# Patient Record
Sex: Male | Born: 1990 | Race: Black or African American | Hispanic: No | Marital: Single | Smoking: Light tobacco smoker
Health system: Southern US, Community
[De-identification: ages and names within clinical notes are randomized; demographics above are authoritative.]

## PROBLEM LIST (undated history)

## (undated) ENCOUNTER — Emergency Department (HOSPITAL_COMMUNITY): Discharge status: Absent without leave | Payer: Managed Care, Other (non HMO)

## (undated) DIAGNOSIS — T7840XA Allergy, unspecified, initial encounter: Secondary | ICD-10-CM

## (undated) DIAGNOSIS — J45909 Unspecified asthma, uncomplicated: Secondary | ICD-10-CM

## (undated) DIAGNOSIS — F329 Major depressive disorder, single episode, unspecified: Secondary | ICD-10-CM

## (undated) DIAGNOSIS — F32A Depression, unspecified: Secondary | ICD-10-CM

## (undated) HISTORY — DX: Unspecified asthma, uncomplicated: J45.909

## (undated) HISTORY — DX: Allergy, unspecified, initial encounter: T78.40XA

## (undated) HISTORY — DX: Major depressive disorder, single episode, unspecified: F32.9

## (undated) HISTORY — DX: Depression, unspecified: F32.A

## (undated) HISTORY — PX: OTHER SURGICAL HISTORY: SHX169

---
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
## 2013-04-06 ENCOUNTER — Encounter (INDEPENDENT_AMBULATORY_CARE_PROVIDER_SITE_OTHER): Payer: Managed Care, Other (non HMO) | Admitting: General Surgery

## 2013-04-07 ENCOUNTER — Encounter (INDEPENDENT_AMBULATORY_CARE_PROVIDER_SITE_OTHER): Payer: Managed Care, Other (non HMO) | Admitting: Surgery

## 2013-04-09 ENCOUNTER — Telehealth (INDEPENDENT_AMBULATORY_CARE_PROVIDER_SITE_OTHER): Payer: Self-pay

## 2013-04-09 NOTE — Telephone Encounter (Signed)
Made PCP aware that patient no showed two urgent office appointments for buttock abscess.

## 2013-04-20 ENCOUNTER — Encounter (INDEPENDENT_AMBULATORY_CARE_PROVIDER_SITE_OTHER): Payer: Managed Care, Other (non HMO) | Admitting: Surgery

## 2013-04-21 ENCOUNTER — Encounter (INDEPENDENT_AMBULATORY_CARE_PROVIDER_SITE_OTHER): Payer: Self-pay | Admitting: General Surgery

## 2013-04-21 ENCOUNTER — Ambulatory Visit (INDEPENDENT_AMBULATORY_CARE_PROVIDER_SITE_OTHER): Payer: Managed Care, Other (non HMO) | Admitting: General Surgery

## 2013-04-21 VITALS — BP 118/70 | HR 56 | Temp 97.9°F | Resp 16 | Ht 70.0 in | Wt 145.4 lb

## 2013-04-21 DIAGNOSIS — K611 Rectal abscess: Secondary | ICD-10-CM

## 2013-04-21 DIAGNOSIS — K612 Anorectal abscess: Secondary | ICD-10-CM

## 2013-04-21 MED ORDER — HYDROCODONE-ACETAMINOPHEN 7.5-325 MG/15ML PO SOLN: 15.0000 mL | Freq: Four times a day (QID) | ORAL | Status: AC | PRN

## 2013-04-21 NOTE — Patient Instructions (Signed)
Shower or soak twice a day then cover with clean gauze

## 2013-05-03 ENCOUNTER — Encounter (INDEPENDENT_AMBULATORY_CARE_PROVIDER_SITE_OTHER): Payer: Self-pay | Admitting: General Surgery

## 2013-05-03 ENCOUNTER — Ambulatory Visit (INDEPENDENT_AMBULATORY_CARE_PROVIDER_SITE_OTHER): Payer: Managed Care, Other (non HMO) | Admitting: General Surgery

## 2013-05-03 VITALS — BP 110/68 | HR 60 | Temp 98.2°F | Resp 14 | Ht 70.0 in | Wt 146.0 lb

## 2013-05-03 DIAGNOSIS — K611 Rectal abscess: Secondary | ICD-10-CM

## 2013-05-03 DIAGNOSIS — K612 Anorectal abscess: Secondary | ICD-10-CM

## 2013-05-03 NOTE — Progress Notes (Signed)
Subjective:     Patient ID: Seth Spence, male   DOB: [birth year], 22 y.o.   MRN: [id number]  HPI The patient is a 22 year old black male who is several weeks out from an incision and drainage of a left buttock abscess in [place]. He has been doing local wound care at home and things seem to be getting better. He denies any pain at the site anymore. His appetite is good and his bowels are working normally. He denies any fever or drainage.  Review of Systems  Constitutional: Negative.   HENT: Negative.   Eyes: Negative.   Respiratory: Negative.   Cardiovascular: Negative.   Gastrointestinal: Negative.   Endocrine: Negative.   Genitourinary: Negative.   Musculoskeletal: Negative.   Skin: Negative.   Allergic/Immunologic: Negative.   Neurological: Negative.   Hematological: Negative.   Psychiatric/Behavioral: Negative.        Objective:   Physical Exam  Constitutional: He is oriented to person, place, and time. He appears well-developed and well-nourished.  HENT:  Head: Normocephalic and atraumatic.  Eyes: Conjunctivae and EOM are normal. Pupils are equal, round, and reactive to light.  Neck: Normal range of motion. Neck supple.  Cardiovascular: Normal rate, regular rhythm and normal heart sounds.   Pulmonary/Chest: Effort normal and breath sounds normal.  Abdominal: Soft. Bowel sounds are normal.  Genitourinary:  The buttock wound is clean and flat. It is almost completely healed. There is no drainage or induration or cellulitis.  Musculoskeletal: Normal range of motion.  Neurological: He is alert and oriented to person, place, and time.  Skin: Skin is warm and dry.  Psychiatric: He has a normal mood and affect. His behavior is normal.       Assessment:     The patient is several weeks status post incision and drainage of left buttock abscess in [place]     Plan:     At this point I think the wound will continue healing nicely. He may return to his normal activities.  Will plan to see him back on a when necessary basis

## 2013-05-03 NOTE — Progress Notes (Signed)
Subjective:     Patient ID: Seth Spence, male   DOB: 29-Jan-1991, 22 y.o.   MRN: [id number]  HPI The patient is a 22 year old black male who recently returned from [place]. While there he developed a left buttock abscess. He had this incised and drained by a surgeon there. He has been changing the dressing on the wound. He continues to have some pain. He said minimal drainage from the area.  Review of Systems  Constitutional: Negative.   HENT: Negative.   Eyes: Negative.   Respiratory: Negative.   Cardiovascular: Negative.   Gastrointestinal: Negative.   Endocrine: Negative.   Genitourinary: Negative.   Musculoskeletal: Negative.   Skin: Positive for wound.  Allergic/Immunologic: Negative.   Neurological: Negative.   Hematological: Negative.   Psychiatric/Behavioral: Negative.        Objective:   Physical Exam  Constitutional: He is oriented to person, place, and time. He appears well-developed and well-nourished.  HENT:  Head: Normocephalic and atraumatic.  Eyes: Conjunctivae and EOM are normal. Pupils are equal, round, and reactive to light.  Neck: Normal range of motion. Neck supple.  Cardiovascular: Normal rate, regular rhythm and normal heart sounds.   Pulmonary/Chest: Effort normal and breath sounds normal.  Abdominal: Soft. Bowel sounds are normal.  Genitourinary:  There is an open wound in his left medial buttock area. The wound looks relatively clean with minimal drainage. There is no tunneling. The wound was redressed today and he tolerated this well.  Musculoskeletal: Normal range of motion.  Neurological: He is alert and oriented to person, place, and time.  Skin: Skin is warm and dry.  Psychiatric: He has a normal mood and affect. His behavior is normal.       Assessment:     The patient is a couple weeks status post incision and drainage of left buttock abscess was done in [place]     Plan:     At this point he will continue to shower and change the  dressing daily. We will plan to see him back in the next couple weeks to recheck the wound.

## 2013-05-03 NOTE — Patient Instructions (Signed)
Continue to keep area clean and dry. 

## 2013-05-06 ENCOUNTER — Encounter (INDEPENDENT_AMBULATORY_CARE_PROVIDER_SITE_OTHER): Payer: Managed Care, Other (non HMO) | Admitting: General Surgery

## 2013-05-08 ENCOUNTER — Other Ambulatory Visit (INDEPENDENT_AMBULATORY_CARE_PROVIDER_SITE_OTHER): Payer: Self-pay | Admitting: General Surgery

## 2013-05-10 ENCOUNTER — Telehealth (INDEPENDENT_AMBULATORY_CARE_PROVIDER_SITE_OTHER): Payer: Self-pay | Admitting: General Surgery

## 2013-05-10 NOTE — Telephone Encounter (Signed)
Pt called in requesting a refill on his pain medication. Was seen 6/23 with Toth,MD. Advised Toth,MD with pts request, he advised if pt is in that much pain he needs to be seen in Urgent. At this time there will be no refills authorized for pain medication. I called pt back and advised what information Toth,MD had given. He said OK and slammed the phone down.

## 2014-10-12 ENCOUNTER — Emergency Department (HOSPITAL_COMMUNITY)
Admission: EM | Admit: 2014-10-12 | Discharge: 2014-10-12 | Disposition: A | Discharge status: Home / Self Care | Payer: Managed Care, Other (non HMO) | Attending: Emergency Medicine | Admitting: Emergency Medicine

## 2014-10-12 ENCOUNTER — Encounter (HOSPITAL_COMMUNITY): Payer: Self-pay | Admitting: Emergency Medicine

## 2014-10-12 DIAGNOSIS — K029 Dental caries, unspecified: Secondary | ICD-10-CM | POA: Insufficient documentation

## 2014-10-12 DIAGNOSIS — K088 Other specified disorders of teeth and supporting structures: Secondary | ICD-10-CM | POA: Diagnosis present

## 2014-10-12 DIAGNOSIS — K0381 Cracked tooth: Secondary | ICD-10-CM | POA: Diagnosis not present

## 2014-10-12 DIAGNOSIS — Z720 Tobacco use: Secondary | ICD-10-CM | POA: Diagnosis not present

## 2014-10-12 DIAGNOSIS — K0889 Other specified disorders of teeth and supporting structures: Secondary | ICD-10-CM

## 2014-10-12 MED ORDER — AMOXICILLIN 500 MG PO CAPS: 500.0000 mg | ORAL_CAPSULE | Freq: Three times a day (TID) | ORAL | Status: DC

## 2014-10-12 MED ORDER — LIDOCAINE VISCOUS 2 % MT SOLN
15.0000 mL | Freq: Once | OROMUCOSAL | Status: AC
  Administered 2014-10-12: 15 mL via OROMUCOSAL
  Filled 2014-10-12: qty 15

## 2014-10-12 MED ORDER — OXYCODONE-ACETAMINOPHEN 5-325 MG PO TABS: 1.0000 | ORAL_TABLET | ORAL | Status: DC | PRN

## 2014-10-12 MED ORDER — OXYCODONE-ACETAMINOPHEN 5-325 MG PO TABS
2.0000 | ORAL_TABLET | Freq: Once | ORAL | Status: AC
  Administered 2014-10-12: 2 via ORAL
  Filled 2014-10-12: qty 2

## 2014-10-12 NOTE — ED Provider Notes (Signed)
CSN: [id number]     Arrival date & time 10/12/14  0014 History   First MD Initiated Contact with Patient 10/12/14 0111     Chief Complaint  Patient presents with  . Dental Pain     (Consider location/radiation/quality/duration/timing/severity/associated sxs/prior Treatment) Patient is a 23 y.o. male presenting with tooth pain.  Dental Pain Location:  Lower Lower teeth location:  32/RL 3rd molar Quality:  Throbbing, aching and constant Severity:  Severe Onset quality:  Gradual Duration:  3 days Timing:  Constant Progression:  Worsening Chronicity:  New Context: dental caries, dental fracture and poor dentition   Context: not abscess   Relieved by:  Nothing Worsened by:  Cold food/drink Ineffective treatments:  NSAIDs and topical anesthetic gel Associated symptoms: no facial swelling and no trismus   Risk factors: smoking     History reviewed. No pertinent past medical history. History reviewed. No pertinent past surgical history. No family history on file. History  Substance Use Topics  . Smoking status: Heavy Tobacco Smoker -- 0.50 packs/day  . Smokeless tobacco: Never Used  . Alcohol Use: No    Review of Systems  HENT: Positive for dental problem. Negative for facial swelling.   All other systems reviewed and are negative.     Allergies  Review of patient's allergies indicates no known allergies.  Home Medications   Prior to Admission medications   Medication Sig Start Date End Date Taking? Authorizing Provider  amoxicillin (AMOXIL) 500 MG capsule Take 1 capsule (500 mg total) by mouth 3 (three) times daily. 10/12/14   Arline Ketter L Chenay Nesmith, PA-C  ibuprofen (ADVIL,MOTRIN) 800 MG tablet  04/01/13   Historical Provider, MD  oxyCODONE-acetaminophen (PERCOCET/ROXICET) 5-325 MG per tablet Take 1 tablet by mouth every 4 (four) hours as needed for severe pain. May take 2 tablets PO q 6 hours for severe pain - Do not take with Tylenol as this tablet already contains  tylenol 10/12/14   Rael Yo L Lounell Schumacher, PA-C   BP 118/71 mmHg  Pulse 66  Temp(Src) 97.9 F (36.6 C) (Oral)  Resp 18  Ht 5\' 9"  (1.753 m)  Wt 150 lb (68.04 kg)  BMI 22.14 kg/m2  SpO2 98% Physical Exam  Constitutional: He is oriented to person, place, and time. He appears well-developed and well-nourished. No distress.  HENT:  Head: Normocephalic and atraumatic.  Right Ear: External ear normal.  Left Ear: External ear normal.  Nose: Nose normal.  Mouth/Throat: Uvula is midline and mucous membranes are normal. No trismus in the jaw. Abnormal dentition. Dental caries present. No uvula swelling.    Submental and sublingual spaces are soft.  Eyes: Conjunctivae are normal.  Neck: Normal range of motion. Neck supple.  Cardiovascular: Normal rate.   Pulmonary/Chest: Effort normal.  Neurological: He is alert and oriented to person, place, and time.  Skin: Skin is warm and dry. He is not diaphoretic.  Psychiatric: He has a normal mood and affect.  Nursing note and vitals reviewed.   ED Course  Procedures (including critical care time) Medications  lidocaine (XYLOCAINE) 2 % viscous mouth solution 15 mL (not administered)  oxyCODONE-acetaminophen (PERCOCET/ROXICET) 5-325 MG per tablet 2 tablet (not administered)    Labs Review Labs Reviewed - No data to display  Imaging Review No results found.   EKG Interpretation None      MDM   Final diagnoses:  Pain, dental    Filed Vitals:   10/12/14 0021  BP: 118/71  Pulse: 66  Temp: 97.9 F (36.6 C)  Resp: 18   Afebrile, NAD, non-toxic appearing, AAOx4.  Patient with toothache.  No gross abscess.  Exam unconcerning for Ludwig's angina or spread of infection.  Will treat with amoxil and pain medicine.  Urged patient to keep follow up with dentist in the AM. Patient is stable at time of discharge       Jeannetta EllisJennifer L Crockett Rallo, PA-C 10/12/14 0133  Harrold DonathNathan R. Rubin PayorPickering, MD 10/13/14 [phone]

## 2014-10-12 NOTE — Discharge Instructions (Signed)
Please follow up with your primary care physician in 1-2 days. If you do not have one please [facility] listed above. Please keep your appointment with the dentist in the morning. Please take pain medication and/or muscle relaxants as prescribed and as needed for pain. Please do not drive on narcotic pain medication or on muscle relaxants. Please read all discharge instructions and return precautions.    Dental Pain A tooth ache may be caused by cavities (tooth decay). Cavities expose the nerve of the tooth to air and hot or cold temperatures. It may come from an infection or abscess (also called a boil or furuncle) around your tooth. It is also often caused by dental caries (tooth decay). This causes the pain you are having. DIAGNOSIS  Your caregiver can diagnose this problem by exam. TREATMENT   If caused by an infection, it may be treated with medications which kill germs (antibiotics) and pain medications as prescribed by your caregiver. Take medications as directed.  Only take over-the-counter or prescription medicines for pain, discomfort, or fever as directed by your caregiver.  Whether the tooth ache today is caused by infection or dental disease, you should see your dentist as soon as possible for further care. SEEK MEDICAL CARE IF: The exam and treatment you received today has been provided on an emergency basis only. This is not a substitute for complete medical or dental care. If your problem worsens or new problems (symptoms) appear, and you are unable to meet with your dentist, call or return to this location. SEEK IMMEDIATE MEDICAL CARE IF:   You have a fever.  You develop redness and swelling of your face, jaw, or neck.  You are unable to open your mouth.  You have severe pain uncontrolled by pain medicine. MAKE SURE YOU:   Understand these instructions.  Will watch your condition.  Will get help right away if you are not doing well  or get worse. Document Released: 10/28/2005 Document Revised: 01/20/2012 Document Reviewed: 06/15/2008 [facility] Patient Information 2015 [employer] [place]. This information is not intended to replace advice given to you by your health care provider. Make sure you discuss any questions you have with your health care provider.

## 2014-10-12 NOTE — ED Notes (Signed)
Pt. reports right lower molar pain for 3 days unrelieved by OTC pain medications .

## 2015-03-07 ENCOUNTER — Encounter (HOSPITAL_COMMUNITY): Payer: Self-pay | Admitting: Emergency Medicine

## 2015-03-07 ENCOUNTER — Emergency Department (HOSPITAL_COMMUNITY)
Admission: EM | Admit: 2015-03-07 | Discharge: 2015-03-07 | Disposition: A | Discharge status: Home / Self Care | Payer: Managed Care, Other (non HMO) | Attending: Emergency Medicine | Admitting: Emergency Medicine

## 2015-03-07 DIAGNOSIS — Y998 Other external cause status: Secondary | ICD-10-CM | POA: Diagnosis not present

## 2015-03-07 DIAGNOSIS — Z720 Tobacco use: Secondary | ICD-10-CM | POA: Diagnosis not present

## 2015-03-07 DIAGNOSIS — W228XXA Striking against or struck by other objects, initial encounter: Secondary | ICD-10-CM | POA: Diagnosis not present

## 2015-03-07 DIAGNOSIS — Y9289 Other specified places as the place of occurrence of the external cause: Secondary | ICD-10-CM | POA: Insufficient documentation

## 2015-03-07 DIAGNOSIS — R55 Syncope and collapse: Secondary | ICD-10-CM | POA: Diagnosis not present

## 2015-03-07 DIAGNOSIS — S0990XA Unspecified injury of head, initial encounter: Secondary | ICD-10-CM | POA: Diagnosis present

## 2015-03-07 DIAGNOSIS — S0181XA Laceration without foreign body of other part of head, initial encounter: Secondary | ICD-10-CM | POA: Diagnosis not present

## 2015-03-07 DIAGNOSIS — Y9389 Activity, other specified: Secondary | ICD-10-CM | POA: Diagnosis not present

## 2015-03-07 LAB — I-STAT CHEM 8, ED
BUN: 18 mg/dL (ref 6–23)
Calcium, Ion: 1.19 mmol/L (ref 1.12–1.23)
Chloride: 99 mmol/L (ref 96–112)
Creatinine, Ser: 1 mg/dL (ref 0.50–1.35)
Glucose, Bld: 107 mg/dL — ABNORMAL HIGH (ref 70–99)
HCT: 49 % (ref 39.0–52.0)
HEMOGLOBIN: 16.7 g/dL (ref 13.0–17.0)
POTASSIUM: 4.5 mmol/L (ref 3.5–5.1)
Sodium: 138 mmol/L (ref 135–145)
TCO2: 25 mmol/L (ref 0–100)

## 2015-03-07 MED ORDER — LIDOCAINE-EPINEPHRINE (PF) 2 %-1:200000 IJ SOLN
20.0000 mL | Freq: Once | INTRAMUSCULAR | Status: AC
  Administered 2015-03-07: 20 mL
  Filled 2015-03-07: qty 20

## 2015-03-07 NOTE — ED Notes (Signed)
PA-C Harris at bedside. 

## 2015-03-07 NOTE — Discharge Instructions (Signed)

## 2015-03-07 NOTE — ED Notes (Signed)
Pt states that he was looking in the mirror, and the next thing he remembers was waking up on the floor. Pt states that before he fell, he was feeling lightheaded.

## 2015-03-07 NOTE — ED Notes (Signed)
Pt. lost his balance and hit his head against the sink this evening , presents with approx. 1/2 inch laceration at lower forehead with minimal bleeding / dressing applied art triage . Alert and oriented / respirations unlabored / ambulatory.

## 2015-03-07 NOTE — ED Provider Notes (Signed)
LACERATION REPAIR Date/Time: 03/07/2015 7:50 AM Performed by: Arthor CaptainHARRIS, Seth Banker Authorized by: Arthor CaptainHARRIS, Seth Spence Consent: Verbal consent obtained. Risks and benefits: risks, benefits and alternatives were discussed Consent given by: patient Patient identity confirmed: verbally with patient and provided demographic data Time out: Immediately prior to procedure a "time out" was called to verify the correct patient, procedure, equipment, support staff and site/side marked as required. Body area: head/neck Location details: left eyebrow Laceration length: 3 cm Foreign bodies: no foreign bodies Tendon involvement: none Nerve involvement: none Vascular damage: no Anesthesia: local infiltration Local anesthetic: lidocaine 2% without epinephrine Anesthetic total: 2 ml Patient sedated: no Preparation: Patient was prepped and draped in the usual sterile fashion. Irrigation solution: saline Irrigation method: syringe Amount of cleaning: standard Debridement: none Degree of undermining: minimal Skin closure: 5-0 Prolene Number of sutures: 4 Technique: simple Approximation: close Dressing: 4x4 sterile gauze Comments: I performed this procedure for Dr. Azalia BilisKevin Spence. I was not involved in mdm of this patient.     Arthor CaptainAbigail Azaria Stegman, PA-C 03/07/15 [id number]  [name], MD 03/07/15 [phone]

## 2015-03-07 NOTE — ED Provider Notes (Addendum)
CSN: [id number]     Arrival date & time 03/07/15  0155 History   First MD Initiated Contact with Patient 03/07/15 [phone]     Chief Complaint  Patient presents with  . Head Laceration  . Loss of Consciousness      HPI Patient reports he was looking at the mirror and the next thing he realized he was on the ground.  He presents now with a laceration to his midline forehead.  No active bleeding.  Reports no significant headache at this time.  He is not on anticoagulants.  He denies neck pain.  No weakness of his arms or legs.  No recent illness.  No preceding palpitations. Denies CP. No prior hx of syncope.    History reviewed. No pertinent past medical history. History reviewed. No pertinent past surgical history. No family history on file. History  Substance Use Topics  . Smoking status: Heavy Tobacco Smoker -- 0.50 packs/day  . Smokeless tobacco: Never Used  . Alcohol Use: No    Review of Systems  All other systems reviewed and are negative.     Allergies  Review of patient's allergies indicates no known allergies.  Home Medications   Prior to Admission medications   Medication Sig Start Date End Date Taking? Authorizing Provider  amoxicillin (AMOXIL) 500 MG capsule Take 1 capsule (500 mg total) by mouth 3 (three) times daily. Patient not taking: Reported on 03/07/2015 10/12/14   Francee Piccolo, PA-C  oxyCODONE-acetaminophen (PERCOCET/ROXICET) 5-325 MG per tablet Take 1 tablet by mouth every 4 (four) hours as needed for severe pain. May take 2 tablets PO q 6 hours for severe pain - Do not take with Tylenol as this tablet already contains tylenol Patient not taking: Reported on 03/07/2015 10/12/14   Victorino Dike Piepenbrink, PA-C   BP 101/59 mmHg  Pulse 52  Temp(Src) 98 F (36.7 C) (Oral)  Resp 16  Ht  (1.778 m)  Wt 145 lb (65.772 kg)  BMI 20.81 kg/m2  SpO2 100% Physical Exam  Constitutional: He is oriented to person, place, and time. He appears well-developed and  well-nourished.  HENT:  Head: Normocephalic and atraumatic.  3cm laceration just superior to the left eyebrow and supraorbital rim. EOM normal.   Eyes: EOM are normal.  Neck: Normal range of motion.  c spine nontender  Cardiovascular: Normal rate, regular rhythm, normal heart sounds and intact distal pulses.   Pulmonary/Chest: Effort normal and breath sounds normal. No respiratory distress.  Abdominal: Soft. He exhibits no distension. There is no tenderness.  Musculoskeletal: Normal range of motion.  Neurological: He is alert and oriented to person, place, and time.  Skin: Skin is warm and dry.  Psychiatric: He has a normal mood and affect. Judgment normal.  Nursing note and vitals reviewed.   ED Course  Procedures (including critical care time) Labs Review Labs Reviewed  I-STAT CHEM 8, ED    Imaging Review No results found.   EKG Interpretation   Date/Time:  Tuesday March 07 2015 05:25:11 EDT Ventricular Rate:  56 PR Interval:  152 QRS Duration: 91 QT Interval:  392 QTC Calculation: 378 R Axis:   100 Text Interpretation:  Sinus rhythm Borderline right axis deviation ST  elev, probable normal early repol pattern No old tracing to compare  Confirmed by Devlin Brink  MD, Skye Plamondon ([id number]) on 03/07/2015 6:15:24 AM      MDM   Final diagnoses:  None    EKG without abnormality.  No indication for head CT.  C-spine is nontender.  C-spine cleared by Nexus criteria.  No indication for imaging of head or neck.  Basic labs will be obtained.  These are normal the patient can be discharged home to follow-up closely with a primary care physician.  Infection and head injury warnings given.  Laceration to be repaired by my physician assistant.  Suture removal in 7 days    Azalia BilisKevin Shaheed Schmuck, MD 03/07/15 [id number]  Azalia BilisKevin Nirvan Laban, MD 06/05/15 [phone]

## 2015-07-28 ENCOUNTER — Emergency Department (HOSPITAL_COMMUNITY)
Admission: EM | Admit: 2015-07-28 | Discharge: 2015-07-28 | Disposition: A | Discharge status: Home / Self Care | Payer: Managed Care, Other (non HMO) | Attending: Emergency Medicine | Admitting: Emergency Medicine

## 2015-07-28 ENCOUNTER — Encounter (HOSPITAL_COMMUNITY): Payer: Self-pay | Admitting: Emergency Medicine

## 2015-07-28 DIAGNOSIS — R110 Nausea: Secondary | ICD-10-CM | POA: Diagnosis not present

## 2015-07-28 DIAGNOSIS — Z8619 Personal history of other infectious and parasitic diseases: Secondary | ICD-10-CM | POA: Diagnosis not present

## 2015-07-28 DIAGNOSIS — L0231 Cutaneous abscess of buttock: Secondary | ICD-10-CM | POA: Diagnosis present

## 2015-07-28 DIAGNOSIS — K611 Rectal abscess: Secondary | ICD-10-CM | POA: Insufficient documentation

## 2015-07-28 DIAGNOSIS — Z720 Tobacco use: Secondary | ICD-10-CM | POA: Diagnosis not present

## 2015-07-28 MED ORDER — LIDOCAINE-EPINEPHRINE (PF) 2 %-1:200000 IJ SOLN
20.0000 mL | Freq: Once | INTRAMUSCULAR | Status: AC
  Administered 2015-07-28: 20 mL via INTRADERMAL
  Filled 2015-07-28: qty 20

## 2015-07-28 MED ORDER — OXYCODONE-ACETAMINOPHEN 5-325 MG PO TABS: 1.0000 | ORAL_TABLET | Freq: Four times a day (QID) | ORAL | Status: DC | PRN

## 2015-07-28 MED ORDER — CLINDAMYCIN HCL 300 MG PO CAPS: 300.0000 mg | ORAL_CAPSULE | Freq: Four times a day (QID) | ORAL | Status: DC

## 2015-07-28 MED ORDER — OXYCODONE-ACETAMINOPHEN 5-325 MG PO TABS
1.0000 | ORAL_TABLET | Freq: Once | ORAL | Status: AC
  Administered 2015-07-28: 1 via ORAL
  Filled 2015-07-28: qty 1

## 2015-07-28 NOTE — ED Notes (Signed)
Per EMS, patient has abcess on left buttock, and has hx of frequent staph infections. Report having fever. VS: bp 112/70, p 84. Patient is from home.

## 2015-07-28 NOTE — ED Notes (Signed)
Dr. yelverton at the bedside.  

## 2015-07-28 NOTE — ED Provider Notes (Signed)
CSN: [id number]     Arrival date & time 07/28/15  0325 History   This chart was scribed for Loren Racer, MD by Arlan Organ, ED Scribe. This patient was seen in room A12C/A12C and the patient's care was started 3:35 AM.   Chief Complaint  Patient presents with  . Abscess   The history is provided by the patient. No language interpreter was used.    HPI Comments: Seth Spence brought in by EMS from home is a 24 y.o. male with a PMHx of abscesses and frequent staph infections who presents to the Emergency Department here for an abscess to the L buttock onset few hours. Denies any drainage from area. Associated nausea also reports. No at home remedies attempted prior to arrival. Denies any fever, chills, vomiting, shortness of breath, or chest pain. No known allergies to medications.  History reviewed. No pertinent past medical history. Past Surgical History  Procedure Laterality Date  . Other surgical history      two years ago, i had abcess lanced   History reviewed. No pertinent family history. Social History  Substance Use Topics  . Smoking status: Heavy Tobacco Smoker -- 0.50 packs/day    Types: Cigarettes  . Smokeless tobacco: Never Used  . Alcohol Use: Yes     Comment: 1 beer per day    Review of Systems  Constitutional: Negative for fever and chills.  Respiratory: Negative for cough and shortness of breath.   Cardiovascular: Negative for chest pain.  Gastrointestinal: Positive for nausea. Negative for vomiting and abdominal pain.  Musculoskeletal: Negative for back pain.  Skin: Positive for wound.  Neurological: Negative for headaches.  Psychiatric/Behavioral: Negative for confusion.  All other systems reviewed and are negative.     Allergies  Review of patient's allergies indicates no known allergies.  Home Medications   Prior to Admission medications   Medication Sig Start Date End Date Taking? Authorizing Provider  clindamycin (CLEOCIN) 300 MG  capsule Take 1 capsule (300 mg total) by mouth 4 (four) times daily. X 7 days 07/28/15   Loren Racer, MD  oxyCODONE-acetaminophen (PERCOCET/ROXICET) 5-325 MG per tablet Take 1 tablet by mouth every 6 (six) hours as needed for severe pain. 07/28/15   Loren Racer, MD   Triage Vitals: BP 104/71 mmHg  Pulse 57  Temp(Src) 98 F (36.7 C) (Oral)  Resp 19  Ht  (1.753 m)  Wt 145 lb (65.772 kg)  BMI 21.40 kg/m2  SpO2 94%   Physical Exam  Constitutional: He is oriented to person, place, and time. He appears well-developed and well-nourished. No distress.  HENT:  Head: Normocephalic and atraumatic.  Mouth/Throat: Oropharynx is clear and moist.  Eyes: EOM are normal. Pupils are equal, round, and reactive to light.  Neck: Normal range of motion. Neck supple.  Cardiovascular: Normal rate and regular rhythm.   Pulmonary/Chest: Effort normal and breath sounds normal. No respiratory distress. He has no wheezes. He has no rales.  Abdominal: Soft. Bowel sounds are normal. He exhibits no distension. There is no tenderness. There is no rebound and no guarding.  Genitourinary:     Musculoskeletal: Normal range of motion. He exhibits no edema or tenderness.  Neurological: He is alert and oriented to person, place, and time.  Skin: Skin is warm and dry. No rash noted. No erythema.  Psychiatric: He has a normal mood and affect. His behavior is normal.  Nursing note and vitals reviewed.   ED Course  INCISION AND DRAINAGE Date/Time: 07/28/2015  4:28 AM Performed by: Loren Racer Authorized by: Ranae Palms, DAVID Type: abscess Body area: anogenital Location details: gluteal cleft Local anesthetic: lidocaine 1% with epinephrine Anesthetic total: 6 ml Scalpel size: 11 Incision type: single straight Complexity: simple Drainage: purulent Drainage amount: moderate Wound treatment: wound left open Patient tolerance: Patient tolerated the procedure well with no immediate complications    (including critical care time)  DIAGNOSTIC STUDIES: Oxygen Saturation is 99% on RA, Normal by my interpretation.    COORDINATION OF CARE: 3:39 AM- Will perform I&D procedure. Discussed treatment plan with pt at bedside and pt agreed to plan.     Labs Review Labs Reviewed - No data to display  Imaging Review No results found. I have personally reviewed and evaluated these images and lab results as part of my medical decision-making.   EKG Interpretation None      MDM   Final diagnoses:  Perirectal abscess      I personally performed the services described in this documentation, which was scribed in my presence. The recorded information has been reviewed and is accurate.  Return precautions given.   Loren Racer, MD 07/28/15 [phone]

## 2015-07-28 NOTE — ED Notes (Signed)
Discussed home care for drainage site. Dressing in place. Extra supplies given.

## 2015-07-28 NOTE — ED Notes (Signed)
I&D kit bedside.

## 2015-07-28 NOTE — Discharge Instructions (Signed)

## 2015-10-09 ENCOUNTER — Ambulatory Visit (INDEPENDENT_AMBULATORY_CARE_PROVIDER_SITE_OTHER): Payer: Managed Care, Other (non HMO) | Admitting: Internal Medicine

## 2015-10-09 ENCOUNTER — Encounter: Payer: Self-pay | Admitting: Internal Medicine

## 2015-10-09 VITALS — BP 126/72 | HR 64 | Temp 98.1°F | Resp 16 | Ht 70.0 in | Wt 144.4 lb

## 2015-10-09 DIAGNOSIS — J45901 Unspecified asthma with (acute) exacerbation: Secondary | ICD-10-CM

## 2015-10-09 MED ORDER — ALBUTEROL SULFATE HFA 108 (90 BASE) MCG/ACT IN AERS: 2.0000 | INHALATION_SPRAY | RESPIRATORY_TRACT | Status: DC | PRN

## 2015-10-09 MED ORDER — PROMETHAZINE-DM 6.25-15 MG/5ML PO SYRP: ORAL_SOLUTION | ORAL | Status: DC

## 2015-10-09 MED ORDER — PREDNISONE 20 MG PO TABS: ORAL_TABLET | ORAL | Status: DC

## 2015-10-09 NOTE — Progress Notes (Signed)
   Subjective:    Patient ID: Seth Spence, male    DOB: 1990-11-26, 24 y.o.   MRN: [id number]  HPI  Patient presents to the office for evaluation of new patient establishment and also is coming in for URI. Patient reports that he has been having a cough and wheezing that has been going on for the last 3 weeks.  He reports that he has been doing a little better but it gets intermittently worse.  He has completed antibiotics and also has an antihistamine. He did not have an inhaler and did not get steroids. He does report mild sore throat.  He does have a lot of drainage down his throat and some sputum production that is mildly yellow.  He reports that he has nasal congestion and some rhinorrhea.  No fevers or chills.   He reports no other current problems.   He has not had a flu shot.   He is currently a Consulting civil engineerstudent and enjoys music.    Review of Systems  Constitutional: Negative for fever, chills and diaphoresis.  HENT: Positive for congestion, postnasal drip, rhinorrhea and sore throat. Negative for ear pain, sinus pressure and trouble swallowing.   Eyes: Negative.   Respiratory: Positive for cough, chest tightness, shortness of breath and wheezing.   Cardiovascular: Negative for chest pain and palpitations.  Gastrointestinal: Negative for nausea, vomiting, abdominal pain, diarrhea and constipation.  Genitourinary: Negative.   Skin: Negative.   Neurological: Negative for dizziness and light-headedness.  Psychiatric/Behavioral: Negative for dysphoric mood and decreased concentration. The patient is not nervous/anxious.        Objective:   Physical Exam  Constitutional: He is oriented to person, place, and time. He appears well-developed and well-nourished. No distress.  HENT:  Head: Normocephalic.  Mouth/Throat: Oropharynx is clear and moist. No oropharyngeal exudate.  Eyes: Conjunctivae are normal. No scleral icterus.  Neck: Normal range of motion. Neck supple. No JVD present.  No thyromegaly present.  Cardiovascular: Normal rate, regular rhythm, normal heart sounds and intact distal pulses.  Exam reveals no gallop and no friction rub.   No murmur heard. Pulmonary/Chest: Effort normal and breath sounds normal. No respiratory distress. He has no wheezes. He has no rales. He exhibits no tenderness.  Abdominal: Soft. Bowel sounds are normal.  Musculoskeletal: Normal range of motion.  Lymphadenopathy:    He has no cervical adenopathy.  Neurological: He is alert and oriented to person, place, and time.  Skin: Skin is warm and dry. He is not diaphoretic.  Psychiatric: He has a normal mood and affect. His behavior is normal. Judgment and thought content normal.  Nursing note and vitals reviewed.   Filed Vitals:   10/09/15 1448  BP: 126/72  Pulse: 64  Temp: 98.1 F (36.7 C)  Resp: 16          Assessment & Plan:    1. Asthma, unspecified asthma severity, with acute exacerbation -prednisone -albuterol -phenergan dextromethorphan -zyrtec

## 2015-10-09 NOTE — Patient Instructions (Signed)

## 2015-10-23 ENCOUNTER — Ambulatory Visit: Payer: Self-pay

## 2016-01-10 ENCOUNTER — Ambulatory Visit: Payer: Self-pay | Admitting: Internal Medicine

## 2016-01-10 NOTE — Progress Notes (Signed)
Patient ID: Seth Spence, male   DOB: 03-02-1991, 25 y.o.   MRN: [id number] R  E  S  C  H  E  D  U  L  E  D

## 2016-01-23 ENCOUNTER — Ambulatory Visit (INDEPENDENT_AMBULATORY_CARE_PROVIDER_SITE_OTHER): Payer: Managed Care, Other (non HMO) | Admitting: Internal Medicine

## 2016-01-23 ENCOUNTER — Encounter: Payer: Self-pay | Admitting: Internal Medicine

## 2016-01-23 ENCOUNTER — Other Ambulatory Visit: Payer: Self-pay | Admitting: Internal Medicine

## 2016-01-23 VITALS — BP 120/84 | HR 68 | Temp 97.7°F | Resp 16 | Ht 70.5 in | Wt 142.2 lb

## 2016-01-23 DIAGNOSIS — Z1212 Encounter for screening for malignant neoplasm of rectum: Secondary | ICD-10-CM

## 2016-01-23 DIAGNOSIS — Z79899 Other long term (current) drug therapy: Secondary | ICD-10-CM | POA: Diagnosis not present

## 2016-01-23 DIAGNOSIS — Z0000 Encounter for general adult medical examination without abnormal findings: Secondary | ICD-10-CM

## 2016-01-23 DIAGNOSIS — E559 Vitamin D deficiency, unspecified: Secondary | ICD-10-CM | POA: Diagnosis not present

## 2016-01-23 DIAGNOSIS — Z111 Encounter for screening for respiratory tuberculosis: Secondary | ICD-10-CM | POA: Diagnosis not present

## 2016-01-23 DIAGNOSIS — R5383 Other fatigue: Secondary | ICD-10-CM

## 2016-01-23 DIAGNOSIS — IMO0001 Reserved for inherently not codable concepts without codable children: Secondary | ICD-10-CM | POA: Insufficient documentation

## 2016-01-23 DIAGNOSIS — R030 Elevated blood-pressure reading, without diagnosis of hypertension: Secondary | ICD-10-CM

## 2016-01-23 DIAGNOSIS — Z23 Encounter for immunization: Secondary | ICD-10-CM

## 2016-01-23 DIAGNOSIS — J4520 Mild intermittent asthma, uncomplicated: Secondary | ICD-10-CM

## 2016-01-23 DIAGNOSIS — Z131 Encounter for screening for diabetes mellitus: Secondary | ICD-10-CM

## 2016-01-23 DIAGNOSIS — Z13220 Encounter for screening for lipoid disorders: Secondary | ICD-10-CM

## 2016-01-23 LAB — CBC WITH DIFFERENTIAL/PLATELET
Basophils Absolute: 0 10*3/uL (ref 0.0–0.1)
Basophils Relative: 0 % (ref 0–1)
Eosinophils Absolute: 0.2 10*3/uL (ref 0.0–0.7)
Eosinophils Relative: 1 % (ref 0–5)
HCT: 45 % (ref 39.0–52.0)
HEMOGLOBIN: 15.1 g/dL (ref 13.0–17.0)
LYMPHS PCT: 13 % (ref 12–46)
Lymphs Abs: 2 10*3/uL (ref 0.7–4.0)
MCH: 31.5 pg (ref 26.0–34.0)
MCHC: 33.6 g/dL (ref 30.0–36.0)
MCV: 93.9 fL (ref 78.0–100.0)
MONOS PCT: 6 % (ref 3–12)
MPV: 9.3 fL (ref 8.6–12.4)
Monocytes Absolute: 0.9 10*3/uL (ref 0.1–1.0)
Neutro Abs: 12 10*3/uL — ABNORMAL HIGH (ref 1.7–7.7)
Neutrophils Relative %: 80 % — ABNORMAL HIGH (ref 43–77)
Platelets: 254 10*3/uL (ref 150–400)
RBC: 4.79 MIL/uL (ref 4.22–5.81)
RDW: 13.8 % (ref 11.5–15.5)
WBC: 15 10*3/uL — AB (ref 4.0–10.5)

## 2016-01-23 LAB — HEPATIC FUNCTION PANEL
ALK PHOS: 58 U/L (ref 40–115)
ALT: 11 U/L (ref 9–46)
AST: 20 U/L (ref 10–40)
Albumin: 4.4 g/dL (ref 3.6–5.1)
BILIRUBIN DIRECT: 0.1 mg/dL (ref <=0.2)
Indirect Bilirubin: 0.4 mg/dL (ref 0.2–1.2)
Total Bilirubin: 0.5 mg/dL (ref 0.2–1.2)
Total Protein: 7.2 g/dL (ref 6.1–8.1)

## 2016-01-23 LAB — IRON AND TIBC
%SAT: 20 % (ref 15–60)
Iron: 67 ug/dL (ref 50–195)
TIBC: 328 ug/dL (ref 250–425)
UIBC: 261 ug/dL (ref 125–400)

## 2016-01-23 LAB — LIPID PANEL
Cholesterol: 111 mg/dL — ABNORMAL LOW (ref 125–200)
HDL: 51 mg/dL (ref >=40)
LDL Cholesterol: 52 mg/dL (ref <130)
Total CHOL/HDL Ratio: 2.2 Ratio (ref <=5.0)
Triglycerides: 40 mg/dL (ref <150)
VLDL: 8 mg/dL (ref <30)

## 2016-01-23 LAB — BASIC METABOLIC PANEL WITH GFR
BUN: 13 mg/dL (ref 7–25)
CHLORIDE: 104 mmol/L (ref 98–110)
CO2: 27 mmol/L (ref 20–31)
CREATININE: 0.89 mg/dL (ref 0.60–1.35)
Calcium: 9.4 mg/dL (ref 8.6–10.3)
GFR, Est African American: >89 mL/min (ref >=60)
GFR, Est Non African American: >89 mL/min (ref >=60)
Glucose, Bld: 87 mg/dL (ref 65–99)
POTASSIUM: 4.1 mmol/L (ref 3.5–5.3)
SODIUM: 140 mmol/L (ref 135–146)

## 2016-01-23 LAB — TSH: TSH: 0.57 m[IU]/L (ref 0.40–4.50)

## 2016-01-23 LAB — MAGNESIUM: MAGNESIUM: 1.9 mg/dL (ref 1.5–2.5)

## 2016-01-23 LAB — VITAMIN B12: Vitamin B-12: 573 pg/mL (ref 200–1100)

## 2016-01-23 MED ORDER — MONTELUKAST SODIUM 10 MG PO TABS
ORAL_TABLET | ORAL | Status: DC
Start: 2016-01-23 — End: 2016-05-02

## 2016-01-23 MED ORDER — FLUTICASONE FUROATE-VILANTEROL 100-25 MCG/INH IN AEPB: INHALATION_SPRAY | RESPIRATORY_TRACT | Status: DC

## 2016-01-23 MED ORDER — ALBUTEROL SULFATE HFA 108 (90 BASE) MCG/ACT IN AERS: 2.0000 | INHALATION_SPRAY | RESPIRATORY_TRACT | Status: DC | PRN

## 2016-01-23 NOTE — Progress Notes (Signed)
Patient ID: Seth Spence, male   DOB: February 24, 1991, 25 y.o.   MRN: [id number]  Annual  Screening/Preventative Visit  This very nice 25 y.o. SBM presents for a Wellness/Preventative Visit .  Patient is being screened for  HTN, Prediabetes, Hyperlipidemia and Vitamin D Deficiency. He does report seasonal allergies and hx/o intermittent wheezing. Has occasional itchy & watery eyes & nose with exposure to seasonal pollens, strong odors and fumes.      Patient's BP is normal at 120/84. Patient denies any cardiac symptoms as chest pain, palpitations, shortness of breath, dizziness or ankle swelling.    Patient's has no knowledge of prior testing of his lipids and endorse an "average" diet of occasional to frequent fast food meals.     Likewise he disavows k/o screening for glucose intolerance or insulin resistance. Patient denies reactive hypoglycemic symptoms, visual blurring, diabetic polys or paresthesias.       Finally, patient has no knowledge of Vitamin D testing or deficiency.     No Known Allergies   Past Medical History  Diagnosis Date  . Allergy   . Asthma   . Depression    Health Maintenance  Topic Date Due  . HIV Screening  07/06/2005  . TETANUS/TDAP  07/06/2009  . INFLUENZA VACCINE  06/12/2015   Immunization History  Administered Date(s) Administered  . PPD Test 01/23/2016  . Tdap 01/23/2016   Past Surgical History  Procedure Laterality Date  . Other surgical history      two years ago, i had abcess lanced   Family History  Problem Relation Age of Onset  . Hypertension Mother   . Depression Mother   . Hypertension Maternal Grandmother    Social History   Social History  . Marital Status: Single    Spouse Name: N/A  . Number of Children: N/A  . Years of Education: N/A   Occupational History  . Student, currently  between semesters in [employer] and [employer]   Social History Main Topics  . Smoking status: Light Tobacco Smoker -- 0.25 packs/day     Types: Cigarettes  . Smokeless tobacco: Never Used  . Alcohol Use: Yes     Comment: 1 beer per day  . Drug Use: No  . Sexual Activity: Yes    Birth Control/ Protection: Condom    ROS Constitutional: Denies fever, chills, weight loss/gain, headaches, insomnia,  night sweats or change in appetite. Does c/o fatigue. Eyes: Denies redness, blurred vision, diplopia, discharge, itchy or watery eyes.  ENT: Denies discharge, congestion, post nasal drip, epistaxis, sore throat, earache, hearing loss, dental pain, Tinnitus, Vertigo, Sinus pain or snoring.  Cardio: Denies chest pain, palpitations, irregular heartbeat, syncope, dyspnea, diaphoresis, orthopnea, PND, claudication or edema Respiratory: denies cough, dyspnea, DOE, pleurisy, hoarseness, laryngitis or wheezing.  Gastrointestinal: Denies dysphagia, heartburn, reflux, water brash, pain, cramps, nausea, vomiting, bloating, diarrhea, constipation, hematemesis, melena, hematochezia, jaundice or hemorrhoids Genitourinary: Denies dysuria, frequency, urgency, nocturia, hesitancy, discharge, hematuria or flank pain Musculoskeletal: Denies arthralgia, myalgia, stiffness, Jt. Swelling, pain, limp or strain/sprain. Denies Falls. Skin: Denies puritis, rash, hives, warts, acne, eczema or change in skin lesion Neuro: No weakness, tremor, incoordination, spasms, paresthesia or pain Psychiatric: Denies confusion, memory loss or sensory loss. Denies Depression. Endocrine: Denies change in weight, skin, hair change, nocturia, and paresthesia, diabetic polys, visual blurring or hyper / hypo glycemic episodes.  Heme/Lymph: No excessive bleeding, bruising or enlarged lymph nodes.  Physical Exam  BP 120/84 mmHg  Pulse 68  Temp(Src) 97.7 F (  36.5 C)  Resp 16  Ht 5' 10.5" (1.791 m)  Wt 142 lb 3.2 oz (64.501 kg)  BMI 20.11 kg/m2  General Appearance: Well nourished, in no apparent distress. Eyes: PERRLA, EOMs, conjunctiva no swelling or erythema, normal fundi  and vessels. Sinuses: No frontal/maxillary tenderness ENT/Mouth: EACs patent / TMs  nl. Nares clear without erythema, swelling, mucoid exudates. Oral hygiene is good. No erythema, swelling, or exudate. Tongue normal, non-obstructing. Tonsils not swollen or erythematous. Hearing normal.  Neck: Supple, thyroid normal. No bruits, nodes or JVD. Respiratory: Respiratory effort normal.  BS equal and clear bilateral without rales, rhonci, wheezing or stridor. Cardio: Heart sounds are normal with regular rate and rhythm and no murmurs, rubs or gallops. Peripheral pulses are normal and equal bilaterally without edema. No aortic or femoral bruits. Chest: symmetric with normal excursions and percussion.  Abdomen: Soft, with Nl bowel sounds. Nontender, no guarding, rebound, hernias, masses, or organomegaly.  Lymphatics: Non tender without lymphadenopathy.  Musculoskeletal: Full ROM all peripheral extremities, joint stability, 5/5 strength, and normal gait. Skin: Warm and dry without rashes, lesions, cyanosis, clubbing or  ecchymosis.  Neuro: Cranial nerves intact, reflexes equal bilaterally. Normal muscle tone, no cerebellar symptoms. Sensation intact.  Pysch: Alert and oriented X 3 with normal affect, insight and judgment appropriate.   Assessment and Plan  1. Annual Preventative/Screening Exam    - POC Hemoccult Bld/Stl  - Urinalysis, Routine w reflex microscopic  - Vitamin B12 - Iron and TIBC - Testosterone - CBC with Differential/Platelet - BASIC METABOLIC PANEL WITH GFR - Hepatic function panel - Magnesium - Lipid panel - TSH - Hemoglobin A1c - Insulin, random - VITAMIN D 25 Hydroxy   2. Elevated BP  - TSH  3. Screening cholesterol level  - Lipid panel - TSH  4. Screening for diabetes mellitus  - Hemoglobin A1c - Insulin, random  5. Vitamin D deficiency  - VITAMIN D 25 Hydroxy   6. Screening for rectal cancer  - POC Hemoccult Bld/Stl   7. Other fatigue - Vitamin  B12 - Iron and TIBC - Testosterone - CBC with Differential/Platelet - TSH  8. Medication management  - Urinalysis, Routine w reflex microscopic  - CBC with Differential/Platelet - BASIC METABOLIC PANEL WITH GFR - Hepatic function panel - Magnesium  9. Asthma, mild intermittent, uncomplicated  - fluticasone furoate-vilanterol (BREO ELLIPTA) 100-25 MCG/INH AEPB; Use 1 inhalation daily for Asthma  Dispense: 60 each; Refill: 99  10. Screening examination for pulmonary tuberculosis  - PPD  11. Need for prophylactic vaccination with combined diphtheria-tetanus-pertussis (DTP) vaccine  - Tdap vaccine greater than or equal to 7yo IM   Continue prudent diet as discussed, weight control, BP monitoring, regular exercise, and medications as discussed.  Discussed med effects and SE's. Routine screening labs and tests as requested with regular follow-up as recommended. Over 40 minutes of exam, counseling, chart review and high complex critical decision making was performed

## 2016-01-23 NOTE — Patient Instructions (Addendum)
Asthma, Adult  Asthma is a recurring condition in which the airways tighten and narrow. Asthma can make it difficult to breathe. It can cause coughing, wheezing, and shortness of breath. Asthma episodes, also called asthma attacks, range from minor to life-threatening. Asthma cannot be cured, but medicines and lifestyle changes can help control it.  CAUSES Asthma is believed to be caused by inherited (genetic) and environmental factors, but its exact cause is unknown. Asthma may be triggered by allergens, lung infections, or irritants in the air. Asthma triggers are different for each person. Common triggers include:   Animal dander.  Dust mites.  Cockroaches.  Pollen from trees or grass.  Mold.  Smoke.  Air pollutants such as dust, household cleaners, hair sprays, aerosol sprays, paint fumes, strong chemicals, or strong odors.  Cold air, weather changes, and winds (which increase molds and pollens in the air).  Strong emotional expressions such as crying or laughing hard.  Stress.  Certain medicines (such as aspirin) or types of drugs (such as beta-blockers).  Sulfites in foods and drinks. Foods and drinks that may contain sulfites include dried fruit, potato chips, and sparkling grape juice.  Infections or inflammatory conditions such as the flu, a cold, or an inflammation of the nasal membranes (rhinitis).  Gastroesophageal reflux disease (GERD).  Exercise or strenuous activity.   SYMPTOMS Symptoms may occur immediately after asthma is triggered or many hours later. Symptoms include:  Wheezing.  Excessive nighttime or early morning coughing.  Frequent or severe coughing with a common cold.  Chest tightness.  Shortness of breath.   DIAGNOSIS  The diagnosis of asthma is made by a review of your medical history and a physical exam. Tests may also be performed. These may include:  Lung function studies. These tests show how much air you breathe in and  out.  Allergy tests.   TREATMENT  Asthma cannot be cured, but it can usually be controlled. Treatment involves identifying and avoiding your asthma triggers. It also involves medicines. There are 2 classes of medicine used for asthma treatment:   Controller medicines. These prevent asthma symptoms from occurring. They are usually taken every day.  Reliever or rescue medicines. These quickly relieve asthma symptoms. They are used as needed and provide short-term relief. Your health care provider will help you create an asthma action plan. An asthma action plan is a written plan for managing and treating your asthma attacks. It includes a list of your asthma triggers and how they may be avoided. It also includes information on when medicines should be taken and when their dosage should be changed. An action plan may also involve the use of a device called a peak flow meter. A peak flow meter measures how well the lungs are working. It helps you monitor your condition.   HOME CARE INSTRUCTIONS   Take medicines only as directed by your health care provider. Speak with your health care provider if you have questions about how or when to take the medicines.  Use a peak flow meter as directed by your health care provider. Record and keep track of readings.  Understand and use the action plan to help minimize or stop an asthma attack without needing to seek medical care.  Control your home environment in the following ways to help prevent asthma attacks:  Do not smoke. Avoid being exposed to secondhand smoke.  Change your heating and air conditioning filter regularly.  Limit your use of fireplaces and wood stoves.  Get rid of  pests (such as roaches and mice) and their droppings.  Throw away plants if you see mold on them.  Clean your floors and dust regularly. Use unscented cleaning products.  Try to have someone else vacuum for you regularly. Stay out of rooms while they are being  vacuumed and for a short while afterward. If you vacuum, use a dust mask from a hardware store, a double-layered or microfilter vacuum cleaner bag, or a vacuum cleaner with a HEPA filter.  Replace carpet with wood, tile, or vinyl flooring. Carpet can trap dander and dust.  Use allergy-proof pillows, mattress covers, and box spring covers.  Wash bed sheets and blankets every week in hot water and dry them in a dryer.  Use blankets that are made of polyester or cotton.  Clean bathrooms and kitchens with bleach. If possible, have someone repaint the walls in these rooms with mold-resistant paint. Keep out of the rooms that are being cleaned and painted.  Wash hands frequently.   SEEK MEDICAL CARE IF:   You have wheezing, shortness of breath, or a cough even if taking medicine to prevent attacks.  The colored mucus you cough up (sputum) is thicker than usual.  Your sputum changes from clear or white to yellow, green, gray, or bloody.  You have any problems that may be related to the medicines you are taking (such as a rash, itching, swelling, or trouble breathing).  You are using a reliever medicine more than 2-3 times per week.  Your peak flow is still at 50-79% of your personal best after following your action plan for 1 hour.  You have a fever.   SEEK IMMEDIATE MEDICAL CARE IF:   You seem to be getting worse and are unresponsive to treatment during an asthma attack.  You are short of breath even at rest.  You get short of breath when doing very little physical activity.  You have difficulty eating, drinking, or talking due to asthma symptoms.  You develop chest pain.  You develop a fast heartbeat.  You have a bluish color to your lips or fingernails.  You are light-headed, dizzy, or faint.   ++++++++++++++++++++++++++++++++++++++++++++++++++++++  Recommend Adult Low Dose Aspirin or   coated  Aspirin 81 mg daily   To reduce risk of Colon Cancer 20 %,    Skin  Cancer 26 % ,   Melanoma 46%   and   Pancreatic cancer 60%   ++++++++++++++++++++++++++++++++++++++++++++++++++++++ Vitamin D goal   is between 70-100.   Please make sure that you are taking your Vitamin D as directed.   It is very important as a natural anti-inflammatory   helping hair, skin, and nails, as well as reducing stroke and heart attack risk.   It helps your bones and helps with mood.  It also decreases numerous cancer risks so please take it as directed.   Low Vit D is associated with a 200-300% higher risk for CANCER   and 200-300% higher risk for HEART   ATTACK  &  STROKE.   .....................................Marland Kitchen  It is also associated with higher death rate at younger ages,   autoimmune diseases like Rheumatoid arthritis, Lupus, Multiple Sclerosis.     Also many other serious conditions, like depression, Alzheimer's  Dementia, infertility, muscle aches, fatigue, fibromyalgia - just to name a few.  ++++++++++++++++++++++++++++++++++++++++++++++++  Recommend the book "The END of DIETING" by Dr Excell Seltzer   & the book "The END of DIABETES " by Dr Excell Seltzer  At [email or web address] -  get book & Audio CD's     Being diabetic has a  300% increased risk for heart attack, stroke, cancer, and alzheimer- type vascular dementia. It is very important that you work harder with diet by avoiding all foods that are white. Avoid white rice (brown & wild rice is OK), white potatoes (sweetpotatoes in moderation is OK), White bread or wheat bread or anything made out of white flour like bagels, donuts, rolls, buns, biscuits, cakes, pastries, cookies, pizza crust, and pasta (made from white flour & egg whites) - vegetarian pasta or spinach or wheat pasta is OK. Multigrain breads like Arnold's or [employer], or multigrain sandwich thins or flatbreads.  Diet, exercise and weight loss can reverse and cure diabetes in the early stages.  Diet, exercise and weight loss is very  important in the control and prevention of complications of diabetes which affects every system in your body, ie. Brain - dementia/stroke, eyes - glaucoma/blindness, heart - heart attack/heart failure, kidneys - dialysis, stomach - gastric paralysis, intestines - malabsorption, nerves - severe painful neuritis, circulation - gangrene & loss of a leg(s), and finally cancer and Alzheimers.    I recommend avoid fried & greasy foods,  sweets/candy, white rice (brown or wild rice or Quinoa is OK), white potatoes (sweet potatoes are OK) - anything made from white flour - bagels, doughnuts, rolls, buns, biscuits,white and wheat breads, pizza crust and traditional pasta made of white flour & egg white(vegetarian pasta or spinach or wheat pasta is OK).  Multi-grain bread is OK - like multi-grain flat bread or sandwich thins. Avoid alcohol in excess. Exercise is also important.    Eat all the vegetables you want - avoid meat, especially red meat and dairy - especially cheese.  Cheese is the most concentrated form of trans-fats which is the worst thing to clog up our arteries. Veggie cheese is OK which can be found in the fresh produce section at [employer] or [employer] or [employer]  ++++++++++++++++++++++++++++++++++++++++++++++++++ DASH Eating Plan  DASH stands for "Dietary Approaches to Stop Hypertension."   The DASH eating plan is a healthy eating plan that has been shown to reduce high blood pressure (hypertension). Additional health benefits may include reducing the risk of type 2 diabetes mellitus, heart disease, and stroke. The DASH eating plan may also help with weight loss.  WHAT DO I NEED TO KNOW ABOUT THE DASH EATING PLAN?  For the DASH eating plan, you will follow these general guidelines:  Choose foods with a percent daily value for sodium of less than 5% (as listed on the food label).  Use salt-free seasonings or herbs instead of table salt or sea salt.  Check with your health care  provider or pharmacist before using salt substitutes.  Eat lower-sodium products, often labeled as "lower sodium" or "no salt added."  Eat fresh foods.  Eat more vegetables, fruits, and low-fat dairy products.    Choose whole grains. Look for the word "whole" as the first word in the ingredient list.  Choose fish   Limit sweets, desserts, sugars, and sugary drinks.  Choose heart-healthy fats.  Eat veggie cheese   Eat more home-cooked food and less restaurant, buffet, and fast food.  Limit fried foods.  Cook foods using methods other than frying.  Limit canned vegetables. If you do use them, rinse them well to decrease the sodium.  When eating at a restaurant, ask that your food be prepared with less salt, or no salt if possible.  WHAT FOODS CAN I EAT?  Read Dr Fara Olden Fuhrman's books on The End of Dieting & The End of Diabetes  Grains  Whole grain or whole wheat bread. Brown rice. Whole grain or whole wheat pasta. Quinoa, bulgur, and whole grain cereals. Low-sodium cereals. Corn or whole wheat flour tortillas. Whole grain cornbread. Whole grain crackers. Low-sodium crackers.  Vegetables  Fresh or frozen vegetables (raw, steamed, roasted, or grilled). Low-sodium or reduced-sodium tomato and vegetable juices. Low-sodium or reduced-sodium tomato sauce and paste. Low-sodium or reduced-sodium canned vegetables.   Fruits  All fresh, canned (in natural juice), or frozen fruits.  Protein Products   All fish and seafood.  Dried beans, peas, or lentils. Unsalted nuts and seeds. Unsalted canned beans.  Dairy  Low-fat dairy products, such as skim or 1% milk, 2% or reduced-fat cheeses, low-fat ricotta or cottage cheese, or plain low-fat yogurt. Low-sodium or reduced-sodium cheeses.  Fats and Oils  Tub margarines without trans fats. Light or reduced-fat mayonnaise and salad dressings (reduced sodium). Avocado. Safflower, olive, or canola oils. Natural peanut  or almond butter.  Other  Unsalted popcorn and pretzels. The items listed above may not be a complete list of recommended foods or beverages. Contact your dietitian for more options.  +++++++++++++++++++++++++++++++++++++++++++  WHAT FOODS ARE NOT RECOMMENDED?  Grains/ White flour or wheat flour  White bread. White pasta. White rice. Refined cornbread. Bagels and croissants. Crackers that contain trans fat.  Vegetables  Creamed or fried vegetables. Vegetables in a . Regular canned vegetables. Regular canned tomato sauce and paste. Regular tomato and vegetable juices.  Fruits  Dried fruits. Canned fruit in light or heavy syrup. Fruit juice.  Meat and Other Protein Products  Meat in general - RED mwaet & White meat.  Fatty cuts of meat. Ribs, chicken wings, bacon, sausage, bologna, salami, chitterlings, fatback, hot dogs, bratwurst, and packaged luncheon meats.  Dairy  Whole or 2% milk, cream, half-and-half, and cream cheese. Whole-fat or sweetened yogurt. Full-fat cheeses or blue cheese. Nondairy creamers and whipped toppings. Processed cheese, cheese spreads, or cheese curds.  Condiments  Onion and garlic salt, seasoned salt, table salt, and sea salt. Canned and packaged gravies. Worcestershire sauce. Tartar sauce. Barbecue sauce. Teriyaki sauce. Soy sauce, including reduced sodium. Steak sauce. Fish sauce. Oyster sauce. Cocktail sauce. Horseradish. Ketchup and mustard. Meat flavorings and tenderizers. Bouillon cubes. Hot sauce. Tabasco sauce. Marinades. Taco seasonings. Relishes.  Fats and Oils Butter, stick margarine, lard, shortening and bacon fat. Coconut, palm kernel, or palm oils. Regular salad dressings.  Pickles and olives. Salted popcorn and pretzels.  The items listed above may not be a complete list of foods and beverages to avoid.   Preventive Care for Adults  A healthy lifestyle and preventive care can promote health and wellness. Preventive health guidelines  for men include the following key practices:  A routine yearly physical is a good way to check with your health care provider about your health and preventative screening. It is a chance to share any concerns and updates on your health and to receive a thorough exam.  Visit your dentist for a routine exam and preventative care every 6 months. Brush your teeth twice a day and floss once a day. Good oral hygiene prevents tooth decay and gum disease.  The frequency of eye exams is based on your age, health, family medical history, use of contact lenses, and other factors. Follow your health care provider's recommendations for frequency of eye exams.  Eat a healthy diet.  Foods such as vegetables, fruits, whole grains, low-fat dairy products, and lean protein foods contain the nutrients you need without too many calories. Decrease your intake of foods high in solid fats, added sugars, and salt. Eat the right amount of calories for you.Get information about a proper diet from your health care provider, if necessary.  Regular physical exercise is one of the most important things you can do for your health. Most adults should get at least 150 minutes of moderate-intensity exercise (any activity that increases your heart rate and causes you to sweat) each week. In addition, most adults need muscle-strengthening exercises on 2 or more days a week.  Maintain a healthy weight. The body mass index (BMI) is a screening tool to identify possible weight problems. It provides an estimate of body fat based on height and weight. Your health care provider can find your BMI and can help you achieve or maintain a healthy weight.For adults 20 years and older:  A BMI below 18.5 is considered underweight.  A BMI of 18.5 to 24.9 is normal.  A BMI of 25 to 29.9 is considered overweight.  A BMI of 30 and above is considered obese.  Maintain normal blood lipids and cholesterol levels by exercising and minimizing your  intake of saturated fat. Eat a balanced diet with plenty of fruit and vegetables. Blood tests for lipids and cholesterol should begin at age 79 and be repeated every 5 years. If your lipid or cholesterol levels are high, you are over 50, or you are at high risk for heart disease, you may need your cholesterol levels checked more frequently.Ongoing high lipid and cholesterol levels should be treated with medicines if diet and exercise are not working.  If you smoke, find out from your health care provider how to quit. If you do not use tobacco, do not start.  Lung cancer screening is recommended for adults aged 76-80 years who are at high risk for developing lung cancer because of a history of smoking. A yearly low-dose CT scan of the lungs is recommended for people who have at least a 30-pack-year history of smoking and are a current smoker or have quit within the past 15 years. A pack year of smoking is smoking an average of 1 pack of cigarettes a day for 1 year (for example: 1 pack a day for 30 years or 2 packs a day for 15 years). Yearly screening should continue until the smoker has stopped smoking for at least 15 years. Yearly screening should be stopped for people who develop a health problem that would prevent them from having lung cancer treatment.  If you choose to drink alcohol, do not have more than 2 drinks per day. One drink is considered to be 12 ounces (355 mL) of beer, 5 ounces (148 mL) of wine, or 1.5 ounces (44 mL) of liquor.  High blood pressure causes heart disease and increases the risk of stroke. Your blood pressure should be checked. Ongoing high blood pressure should be treated with medicines, if weight loss and exercise are not effective.  If you are 72-76 years old, ask your health care provider if you should take aspirin to prevent heart disease.  Diabetes screening involves taking a blood sample to check your fasting blood sugar level. Testing should be considered at a  younger age or be carried out more frequently if you are overweight and have at least 1 risk factor for diabetes.  Colorectal cancer can be detected and often prevented.  Most routine colorectal cancer screening begins at the age of 25 and continues through age 13. However, your health care provider may recommend screening at an earlier age if you have risk factors for colon cancer. On a yearly basis, your health care provider may provide home test kits to check for hidden blood in the stool. Use of a small camera at the end of a tube to directly examine the colon (sigmoidoscopy or colonoscopy) can detect the earliest forms of colorectal cancer. Talk to your health care provider about this at age 6, when routine screening begins. Direct exam of the colon should be repeated every 5-10 years through age 57, unless early forms of precancerous polyps or small growths are found.  Screening for abdominal aortic aneurysm (AAA)  are recommended for persons over age 83 who have history of hypertensionor who are current or former smokers.  Talk with your health care provider about prostate cancer screening.  Testicular cancer screening is recommended for adult males. Screening includes self-exam, a health care provider exam, and other screening tests. Consult with your health care provider about any symptoms you have or any concerns you have about testicular cancer.  Use sunscreen. Apply sunscreen liberally and repeatedly throughout the day. You should seek shade when your shadow is shorter than you. Protect yourself by wearing long sleeves, pants, a wide-brimmed hat, and sunglasses year round, whenever you are outdoors.  Once a month, do a whole-body skin exam, using a mirror to look at the skin on your back. Tell your health care provider about new moles, moles that have irregular borders, moles that are larger than a pencil eraser, or moles that have changed in shape or color.  Stay current with required  vaccines (immunizations).  Influenza vaccine. All adults should be immunized every year.  Tetanus, diphtheria, and acellular pertussis (Td, Tdap) vaccine. An adult who has not previously received Tdap or who does not know his vaccine status should receive 1 dose of Tdap. This initial dose should be followed by tetanus and diphtheria toxoids (Td) booster doses every 10 years. Adults with an unknown or incomplete history of completing a 3-dose immunization series with Td-containing vaccines should begin or complete a primary immunization series including a Tdap dose. Adults should receive a Td booster every 10 years.  Zoster vaccine. One dose is recommended for adults aged 23 years or older unless certain conditions are present.    Pneumococcal 13-valent conjugate (PCV13) vaccine. When indicated, a person who is uncertain of his immunization history and has no record of immunization should receive the PCV13 vaccine. An adult aged 29 years or older who has certain medical conditions and has not been previously immunized should receive 1 dose of PCV13 vaccine. This PCV13 should be followed with a dose of pneumococcal polysaccharide (PPSV23) vaccine. The PPSV23 vaccine dose should be obtained at least 8 weeks after the dose of PCV13 vaccine. An adult aged 69 years or older who has certain medical conditions and previously received 1 or more doses of PPSV23 vaccine should receive 1 dose of PCV13. The PCV13 vaccine dose should be obtained 1 or more years after the last PPSV23 vaccine dose.    Pneumococcal polysaccharide (PPSV23) vaccine. When PCV13 is also indicated, PCV13 should be obtained first. All adults aged 70 years and older should be immunized. An adult younger than age 18 years who has certain medical conditions should be immunized. Any person who resides in a nursing home or long-term care facility should be immunized.  An adult smoker should be immunized. People with an immunocompromised condition  and certain other conditions should receive both PCV13 and PPSV23 vaccines. People with human immunodeficiency virus (HIV) infection should be immunized as soon as possible after diagnosis. Immunization during chemotherapy or radiation therapy should be avoided. Routine use of PPSV23 vaccine is not recommended for American Indians, [place] Natives, or people younger than 65 years unless there are medical conditions that require PPSV23 vaccine. When indicated, people who have unknown immunization and have no record of immunization should receive PPSV23 vaccine. One-time revaccination 5 years after the first dose of PPSV23 is recommended for people aged 19-64 years who have chronic kidney failure, nephrotic syndrome, asplenia, or immunocompromised conditions. People who received 1-2 doses of PPSV23 before age 4 years should receive another dose of PPSV23 vaccine at age 11 years or later if at least 5 years have passed since the previous dose. Doses of PPSV23 are not needed for people immunized with PPSV23 at or after age 63 years.  Hepatitis A vaccine. Adults who wish to be protected from this disease, have certain high-risk conditions, work with hepatitis A-infected animals, work in hepatitis A research labs, or travel to or work in countries with a high rate of hepatitis A should be immunized. Adults who were previously unvaccinated and who anticipate close contact with an international adoptee during the first 60 days after arrival in the [place] from a country with a high rate of hepatitis A should be immunized.  Hepatitis B vaccine. Adults should be immunized if they wish to be protected from this disease, have certain high-risk conditions, may be exposed to blood or other infectious body fluids, are household contacts or sex partners of hepatitis B positive people, are clients or workers in certain care facilities, or travel to or work in countries with a high rate of hepatitis B.  Preventive Service  / Frequency  Ages 72 to 77  Blood pressure check.  Lipid and cholesterol check.  Hepatitis C blood test.** / For any individual with known risks for hepatitis C.  Skin self-exam. / Monthly.  Influenza vaccine. / Every year.  Tetanus, diphtheria, and acellular pertussis (Tdap, Td) vaccine.** / Consult your health care provider. 1 dose of Td every 10 years.  HPV vaccine. / 3 doses over 6 months, if 67 or younger.  Measles, mumps, rubella (MMR) vaccine.** / You need at least 1 dose of MMR if you were born in 1957 or later. You may also need a second dose.  Pneumococcal 13-valent conjugate (PCV13) vaccine.** / Consult your health care provider.  Pneumococcal polysaccharide (PPSV23) vaccine.** / 1 to 2 doses if you smoke cigarettes or if you have certain conditions.  Meningococcal vaccine.** / 1 dose if you are age 36 to 3 years and a Market researcher living in a residence hall, or have one of several medical conditions. You may also need additional booster doses.  Hepatitis A vaccine.** / Consult your health care provider.  Hepatitis B vaccine.** / Consult your health care provider.

## 2016-01-24 LAB — URINALYSIS, ROUTINE W REFLEX MICROSCOPIC
BILIRUBIN URINE: NEGATIVE
GLUCOSE, UA: NEGATIVE
Ketones, ur: NEGATIVE
Nitrite: NEGATIVE
PH: 6 (ref 5.0–8.0)
PROTEIN: NEGATIVE
Specific Gravity, Urine: 1.023 (ref 1.001–1.035)

## 2016-01-24 LAB — HEMOGLOBIN A1C
HEMOGLOBIN A1C: 5.4 % (ref <5.7)
Mean Plasma Glucose: 108 mg/dL (ref <117)

## 2016-01-24 LAB — URINALYSIS, MICROSCOPIC ONLY
Bacteria, UA: NONE SEEN [HPF]
CRYSTALS: NONE SEEN [HPF]
Casts: NONE SEEN [LPF]
Yeast: NONE SEEN [HPF]

## 2016-01-24 LAB — INSULIN, RANDOM: Insulin: 4 u[IU]/mL (ref 2.0–19.6)

## 2016-01-24 LAB — TESTOSTERONE: TESTOSTERONE: 629 ng/dL (ref 250–827)

## 2016-01-24 LAB — VITAMIN D 25 HYDROXY (VIT D DEFICIENCY, FRACTURES): VIT D 25 HYDROXY: 16 ng/mL — AB (ref 30–100)

## 2016-04-06 ENCOUNTER — Emergency Department (HOSPITAL_COMMUNITY)
Admission: EM | Admit: 2016-04-06 | Discharge: 2016-04-06 | Disposition: A | Discharge status: Home / Self Care | Payer: Managed Care, Other (non HMO) | Attending: Emergency Medicine | Admitting: Emergency Medicine

## 2016-04-06 ENCOUNTER — Encounter (HOSPITAL_COMMUNITY): Payer: Self-pay | Admitting: Emergency Medicine

## 2016-04-06 DIAGNOSIS — J45909 Unspecified asthma, uncomplicated: Secondary | ICD-10-CM | POA: Insufficient documentation

## 2016-04-06 DIAGNOSIS — K0889 Other specified disorders of teeth and supporting structures: Secondary | ICD-10-CM | POA: Diagnosis present

## 2016-04-06 DIAGNOSIS — F17210 Nicotine dependence, cigarettes, uncomplicated: Secondary | ICD-10-CM | POA: Diagnosis not present

## 2016-04-06 DIAGNOSIS — Z79899 Other long term (current) drug therapy: Secondary | ICD-10-CM | POA: Insufficient documentation

## 2016-04-06 DIAGNOSIS — Z8659 Personal history of other mental and behavioral disorders: Secondary | ICD-10-CM | POA: Diagnosis not present

## 2016-04-06 MED ORDER — PENICILLIN V POTASSIUM 500 MG PO TABS: 500.0000 mg | ORAL_TABLET | Freq: Four times a day (QID) | ORAL | Status: AC

## 2016-04-06 MED ORDER — OXYCODONE-ACETAMINOPHEN 5-325 MG PO TABS
1.0000 | ORAL_TABLET | Freq: Once | ORAL | Status: AC
  Administered 2016-04-06: 1 via ORAL
  Filled 2016-04-06: qty 1

## 2016-04-06 MED ORDER — OXYCODONE-ACETAMINOPHEN 5-325 MG PO TABS: 2.0000 | ORAL_TABLET | ORAL | Status: DC | PRN

## 2016-04-06 MED ORDER — KETOROLAC TROMETHAMINE 60 MG/2ML IM SOLN
60.0000 mg | Freq: Once | INTRAMUSCULAR | Status: AC
  Administered 2016-04-06: 60 mg via INTRAMUSCULAR
  Filled 2016-04-06: qty 2

## 2016-04-06 NOTE — ED Notes (Signed)
Family at bedside. 

## 2016-04-06 NOTE — ED Notes (Signed)
The patient says he started having dental pain two months ago and he was seen for it.  He said he went to [facility] was given antibiotics and ibuprofen.  He advised the pain returned he rates it 10/10.

## 2016-04-06 NOTE — ED Notes (Signed)
Patient verbalized understanding of discharge instructions and denies any further needs or questions at this time. VS stable. Patient ambulatory with steady gait.  

## 2016-04-06 NOTE — Discharge Instructions (Signed)
Take pain medication as needed. Take antibiotics as prescribed. Follow up with oral surgeon as soon as possible for consultation and tooth extraction. Return to the ED if you experience severe worsening of your symptoms, facial swelling, fevers, chills, difficulty breathing.

## 2016-04-07 NOTE — ED Provider Notes (Signed)
CSN: [id number]     Arrival date & time 04/06/16  1805 History   First MD Initiated Contact with Patient 04/06/16 1815     Chief Complaint  Patient presents with  . Dental Pain    The patient says he started having dental pain two months ago and he was seen for it.  He said he went to [facility] was given antibiotics and ibuprofen.  He advised the pain returned he rates it 10/10.     (Consider location/radiation/quality/duration/timing/severity/associated sxs/prior Treatment) HPI   Seth Spence is a 25 y.o M with no significant pmhx who presents to the ED today c/o dental pain. Pt states that his right upper wisdom tooth has been growing in for several years. He states that last February is broke off and caused him severe pain. He was seen and evaluated for at that time. He was given antibiotics and pain medications which improved his symptoms. However, today pt states that his pain drastically increased when he woke up this morning. Patient reports pain with opening his mouth. He states he is unable to chew due to the pain he is tried taking ibuprofen without relief. He states he has dental insurance but has not been able to follow up with a dentist since February.  Past Medical History  Diagnosis Date  . Allergy   . Asthma   . Depression    Past Surgical History  Procedure Laterality Date  . Other surgical history      two years ago, i had abcess lanced   Family History  Problem Relation Age of Onset  . Hypertension Mother   . Depression Mother   . Hypertension Maternal Grandmother    Social History  Substance Use Topics  . Smoking status: Light Tobacco Smoker -- 0.25 packs/day    Types: Cigarettes  . Smokeless tobacco: Never Used  . Alcohol Use: Yes     Comment: 1 beer per day    Review of Systems  All other systems reviewed and are negative.     Allergies  Review of patient's allergies indicates no known allergies.  Home Medications   Prior to Admission  medications   Medication Sig Start Date End Date Taking? Authorizing Provider  albuterol (PROVENTIL HFA;VENTOLIN HFA) 108 (90 Base) MCG/ACT inhaler Inhale 2 puffs into the lungs every 2 (two) hours as needed for wheezing or shortness of breath (cough). 01/23/16   Lucky Cowboy, MD  fluticasone furoate-vilanterol (BREO ELLIPTA) 100-25 MCG/INH AEPB Use 1 inhalation daily for Asthma 01/23/16 01/22/17  Lucky Cowboy, MD  montelukast (SINGULAIR) 10 MG tablet Take 1 tablet daily for Asthma & Allergies 01/23/16 01/22/17  Lucky Cowboy, MD  oxyCODONE-acetaminophen (PERCOCET/ROXICET) 5-325 MG tablet Take 2 tablets by mouth every 4 (four) hours as needed for severe pain. 04/06/16   Mervil Wacker Tripp Bora Bost, PA-C  penicillin v potassium (VEETID) 500 MG tablet Take 1 tablet (500 mg total) by mouth 4 (four) times daily. 04/06/16 04/13/16  Rie Mcneil Tripp Wilhelmena Zea, PA-C   BP 123/83 mmHg  Pulse 74  Temp(Src) 99 F (37.2 C) (Oral)  Resp 18  SpO2 100% Physical Exam  Constitutional: He is oriented to person, place, and time. He appears well-developed and well-nourished. No distress.  HENT:  Head: Normocephalic and atraumatic.  Mouth/Throat: Oropharynx is clear and moist. No oropharyngeal exudate.  Right upper wisdom tooth partially grown in. No surrounding abscess. Mild gingival irritation. No facial swelling, lip or tongue swelling  Eyes: Conjunctivae are normal. Right eye exhibits no discharge.  Left eye exhibits no discharge. No scleral icterus.  Neck: Neck supple.  Cardiovascular: Normal rate.   Pulmonary/Chest: Effort normal.  Lymphadenopathy:    He has no cervical adenopathy.  Neurological: He is alert and oriented to person, place, and time. Coordination normal.  Skin: Skin is warm and dry. No rash noted. He is not diaphoretic. No erythema. No pallor.  Psychiatric: He has a normal mood and affect. His behavior is normal.  Nursing note and vitals reviewed.   ED Course  Procedures (including critical  care time) Labs Review Labs Reviewed - No data to display  Imaging Review No results found. I have personally reviewed and evaluated these images and lab results as part of my medical decision-making.   EKG Interpretation None      MDM   Final diagnoses:  Pain, dental   Otherwise healthy 25 year old male presents the ED today complaining of dental pain. His right upper wisdom tooth is partially grown in. This has been ongoing since February. His pain drastically increased today. He has dental insurance but has not seen a dentist or an Transport planneroral surgeon. No obvious abscess. No evidence of spreading infection to deep space or Ludwig's angina. There is mild gingival irritation. Pain managed in ED. Will give PCN and pain medication. Recommend follow-up with oral surgery. Referral given.    Lester KinsmanSamantha Tripp Broadmoor[place]ow[place], PA-C 04/07/16 1541  Marily MemosJason Mesner, MD 04/07/16 2131

## 2016-05-02 ENCOUNTER — Ambulatory Visit (INDEPENDENT_AMBULATORY_CARE_PROVIDER_SITE_OTHER): Payer: Managed Care, Other (non HMO) | Admitting: Internal Medicine

## 2016-05-02 ENCOUNTER — Encounter: Payer: Self-pay | Admitting: Internal Medicine

## 2016-05-02 VITALS — BP 122/80 | HR 88 | Temp 98.4°F | Resp 18 | Ht 70.0 in | Wt 141.0 lb

## 2016-05-02 DIAGNOSIS — Z113 Encounter for screening for infections with a predominantly sexual mode of transmission: Secondary | ICD-10-CM

## 2016-05-02 NOTE — Patient Instructions (Addendum)
[facility] Address: [address], [place], [place] [address] Phone: [phone]  Dr. Mechele ClaudeJanna Cloninger [facility]  [address], [place], [place] [address]  [phone]  If you cannot get into either of these contact [facility]

## 2016-05-02 NOTE — Progress Notes (Signed)
   Subjective:    Patient ID: Seth Spence, male    DOB: 08-31-1991, 25 y.o.   MRN: [id number]  HPI  Patient presents to the office for reevaluation of STD testing.  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  He has no history of HSV 2 that he is aware of.  He denies changes in partners.  He denies any rash or outbreak history.  He has never had a rash on the buttocks.  He reports that he has had a history of a perianal abscess but no other history.      Review of Systems  Constitutional: Negative for fever, chills and fatigue.  Genitourinary: Negative for dysuria, frequency, hematuria, decreased urine volume, penile swelling, scrotal swelling, penile pain and testicular pain.  Skin: Negative for rash.       Objective:   Physical Exam  Constitutional: He is oriented to person, place, and time. He appears well-developed and well-nourished. No distress.  HENT:  Head: Normocephalic.  Mouth/Throat: Oropharynx is clear and moist. No oropharyngeal exudate.  Eyes: Conjunctivae are normal. No scleral icterus.  Neck: Normal range of motion. Neck supple. No JVD present. No thyromegaly present.  Cardiovascular: Normal rate, regular rhythm, normal heart sounds and intact distal pulses.  Exam reveals no gallop and no friction rub.   No murmur heard. Pulmonary/Chest: Effort normal and breath sounds normal. No respiratory distress. He has no wheezes. He has no rales. He exhibits no tenderness.  Abdominal: Soft. Bowel sounds are normal. He exhibits no distension and no mass. There is no tenderness. There is no rebound and no guarding.  Musculoskeletal: Normal range of motion.  Lymphadenopathy:    He has no cervical adenopathy.  Neurological: He is alert and oriented to person, place, and time.  Skin: Skin is warm and dry. No rash noted. He is not diaphoretic.  Psychiatric: He has a normal mood and affect. His behavior is normal. Judgment and thought  content normal.  Nursing note and vitals reviewed.   Filed Vitals:   05/02/16 1449  BP: 122/80  Pulse: 88  Temp: 98.4 F (36.9 C)  Resp: 18       Assessment & Plan:    1. Screening for STD (sexually transmitted disease) -discussed treatment with valtrex if outbreaks -do not feel that daily medication is currently necessary to avoid outbreaks.  - HSV(herpes simplex vrs) 1+2 ab-IgG

## 2016-05-03 ENCOUNTER — Encounter: Payer: Self-pay | Admitting: Internal Medicine

## 2016-05-03 LAB — HSV(HERPES SIMPLEX VRS) I + II AB-IGG: HSV 2 Glycoprotein G Ab, IgG: 4.9 Index — ABNORMAL HIGH (ref <0.90)

## 2016-07-17 ENCOUNTER — Emergency Department (HOSPITAL_COMMUNITY)
Admission: EM | Admit: 2016-07-17 | Discharge: 2016-07-17 | Disposition: A | Discharge status: Home / Self Care | Payer: Managed Care, Other (non HMO) | Attending: Physician Assistant | Admitting: Physician Assistant

## 2016-07-17 ENCOUNTER — Encounter (HOSPITAL_COMMUNITY): Payer: Self-pay | Admitting: Emergency Medicine

## 2016-07-17 DIAGNOSIS — F17210 Nicotine dependence, cigarettes, uncomplicated: Secondary | ICD-10-CM | POA: Insufficient documentation

## 2016-07-17 DIAGNOSIS — R112 Nausea with vomiting, unspecified: Secondary | ICD-10-CM | POA: Insufficient documentation

## 2016-07-17 DIAGNOSIS — R197 Diarrhea, unspecified: Secondary | ICD-10-CM | POA: Insufficient documentation

## 2016-07-17 DIAGNOSIS — J45909 Unspecified asthma, uncomplicated: Secondary | ICD-10-CM | POA: Insufficient documentation

## 2016-07-17 LAB — CBC WITH DIFFERENTIAL/PLATELET
Basophils Absolute: 0 10*3/uL (ref 0.0–0.1)
Basophils Relative: 0 %
EOS ABS: 0.1 10*3/uL (ref 0.0–0.7)
EOS PCT: 0 %
HCT: 43.6 % (ref 39.0–52.0)
Hemoglobin: 14.3 g/dL (ref 13.0–17.0)
LYMPHS ABS: 2 10*3/uL (ref 0.7–4.0)
Lymphocytes Relative: 18 %
MCH: 31.7 pg (ref 26.0–34.0)
MCHC: 32.8 g/dL (ref 30.0–36.0)
MCV: 96.7 fL (ref 78.0–100.0)
Monocytes Absolute: 0.5 10*3/uL (ref 0.1–1.0)
Monocytes Relative: 4 %
Neutro Abs: 8.9 10*3/uL — ABNORMAL HIGH (ref 1.7–7.7)
Neutrophils Relative %: 78 %
PLATELETS: 224 10*3/uL (ref 150–400)
RBC: 4.51 MIL/uL (ref 4.22–5.81)
RDW: 13.4 % (ref 11.5–15.5)
WBC: 11.4 10*3/uL — AB (ref 4.0–10.5)

## 2016-07-17 LAB — BASIC METABOLIC PANEL
Anion gap: 7 (ref 5–15)
BUN: 11 mg/dL (ref 6–20)
CO2: 27 mmol/L (ref 22–32)
CREATININE: 0.87 mg/dL (ref 0.61–1.24)
Calcium: 9.2 mg/dL (ref 8.9–10.3)
Chloride: 106 mmol/L (ref 101–111)
GFR calc Af Amer: >60 mL/min (ref >60)
Glucose, Bld: 102 mg/dL — ABNORMAL HIGH (ref 65–99)
POTASSIUM: 4.1 mmol/L (ref 3.5–5.1)
SODIUM: 140 mmol/L (ref 135–145)

## 2016-07-17 MED ORDER — ONDANSETRON 4 MG PO TBDP: 4.0000 mg | ORAL_TABLET | Freq: Three times a day (TID) | ORAL | 0 refills | Status: DC | PRN

## 2016-07-17 MED ORDER — SODIUM CHLORIDE 0.9 % IV BOLUS (SEPSIS)
1000.0000 mL | Freq: Once | INTRAVENOUS | Status: AC
  Administered 2016-07-17: 1000 mL via INTRAVENOUS

## 2016-07-17 MED ORDER — ONDANSETRON HCL 4 MG/2ML IJ SOLN
4.0000 mg | Freq: Once | INTRAMUSCULAR | Status: AC
  Administered 2016-07-17: 4 mg via INTRAVENOUS
  Filled 2016-07-17: qty 2

## 2016-07-17 NOTE — ED Triage Notes (Signed)
Pt sts had N/V/D yesterday feeling much improved today with a little nausea; pt sts needs work note

## 2016-07-17 NOTE — ED Notes (Signed)
Pt ambulated to bathroom. Pt tolerated well.

## 2016-07-17 NOTE — ED Provider Notes (Signed)
[facility] DEPT Provider Note   CSN: [id number] Arrival date & time: 07/17/16  1002     History   Chief Complaint Chief Complaint  Patient presents with  . Emesis  . Diarrhea    HPI Seth Spence is a 25 y.o. male.  Patient is a 25 year old male with no pertinent past medical history presents the ED with complaint of vomiting and diarrhea, onset yesterday. Patient reports having multiple episodes of NBNB vomiting and nonbloody diarrhea that started yesterday morning. He reports he thinks he got sick from cookout fast food that he had the night before. Patient reports he has been eating and drinking less over the past 2 days due to his nausea. Denies fever, chills, chest pain, shortness of breath, abdominal pain, urinary symptoms, blood in urine, stool or emesis. Denies any known sick contacts. Denies taking any medications for his symptoms.      Past Medical History:  Diagnosis Date  . Allergy   . Asthma   . Depression     Patient Active Problem List   Diagnosis Date Noted  . Elevated BP 01/23/2016  . Screening cholesterol level 01/23/2016  . Screening for diabetes mellitus 01/23/2016  . Vitamin D deficiency 01/23/2016  . Medication management 01/23/2016  . Perirectal abscess 04/21/2013    Past Surgical History:  Procedure Laterality Date  . OTHER SURGICAL HISTORY     two years ago, i had abcess lanced       Home Medications    Prior to Admission medications   Medication Sig Start Date End Date Taking? Authorizing Provider  metroNIDAZOLE (FLAGYL) 500 MG tablet Take 500 mg by mouth 2 (two) times daily.    Historical Provider, MD  ondansetron (ZOFRAN ODT) 4 MG disintegrating tablet Take 1 tablet (4 mg total) by mouth every 8 (eight) hours as needed for nausea or vomiting. 07/17/16   Barrett HenleNicole Elizabeth Savior Himebaugh, PA-C    Family History Family History  Problem Relation Age of Onset  . Hypertension Mother   . Depression Mother   . Hypertension Maternal  Grandmother     Social History Social History  Substance Use Topics  . Smoking status: Light Tobacco Smoker    Packs/day: 0.25    Types: Cigarettes  . Smokeless tobacco: Never Used  . Alcohol use Yes     Comment: 1 beer per day     Allergies   Review of patient's allergies indicates no known allergies.   Review of Systems Review of Systems  Gastrointestinal: Positive for diarrhea, nausea and vomiting.  All other systems reviewed and are negative.    Physical Exam Updated Vital Signs BP 119/86   Pulse (!) 59   Temp 98.2 F (36.8 C) (Oral)   Resp 18   SpO2 100%   Physical Exam  Constitutional: He is oriented to person, place, and time. He appears well-developed and well-nourished. No distress.  HENT:  Head: Normocephalic and atraumatic.  Mouth/Throat: Uvula is midline and oropharynx is clear and moist. Mucous membranes are dry. No oropharyngeal exudate, posterior oropharyngeal edema, posterior oropharyngeal erythema or tonsillar abscesses. No tonsillar exudate.  Eyes: Conjunctivae and EOM are normal. Right eye exhibits no discharge. Left eye exhibits no discharge. No scleral icterus.  Neck: Normal range of motion. Neck supple.  Cardiovascular: Normal rate, regular rhythm, normal heart sounds and intact distal pulses.   Pulmonary/Chest: Effort normal and breath sounds normal. No respiratory distress. He has no wheezes. He has no rales. He exhibits no tenderness.  Abdominal: Soft.  Bowel sounds are normal. He exhibits no distension and no mass. There is no tenderness. There is no rebound and no guarding. No hernia.  Musculoskeletal: Normal range of motion. He exhibits no edema.  Neurological: He is alert and oriented to person, place, and time.  Skin: Skin is warm and dry. He is not diaphoretic.  Nursing note and vitals reviewed.    ED Treatments / Results  Labs (all labs ordered are listed, but only abnormal results are displayed) Labs Reviewed  CBC WITH  DIFFERENTIAL/PLATELET - Abnormal; Notable for the following:       Result Value   WBC 11.4 (*)    Neutro Abs 8.9 (*)    All other components within normal limits  BASIC METABOLIC PANEL - Abnormal; Notable for the following:    Glucose, Bld 102 (*)    All other components within normal limits    EKG  EKG Interpretation None       Radiology No results found.  Procedures Procedures (including critical care time)  Medications Ordered in ED Medications  sodium chloride 0.9 % bolus 1,000 mL (1,000 mLs Intravenous New Bag/Given 07/17/16 1123)  ondansetron (ZOFRAN) injection 4 mg (4 mg Intravenous Given 07/17/16 1155)     Initial Impression / Assessment and Plan / ED Course  I have reviewed the triage vital signs and the nursing notes.  Pertinent labs & imaging results that were available during my care of the patient were reviewed by me and considered in my medical decision making (see chart for details).  Clinical Course    Patient presents with nausea, NBNB vomiting and nonbloody diarrhea that started yesterday morning. He reports he thinks he got sick from cookout he had the night prior. Denies any other known sick contacts. Denies abdominal pain or fever. Ports decreased oral intake due to symptoms. VSS. Exam revealed dry mucous membranes, abdominal exam benign, remaining exam unremarkable. Patient given IV fluids and Zofran. WBC 11.4. Remaining labs unremarkable. On reexamination patient reports he is feeling much better. Patient able to tolerate PO. I suspect patient's symptoms are likely due to viral gastroenteritis and do not feel any further workup or imaging is warranted at this time. I do not suspect appendicitis, bowel obstruction, perforation or any other acute surgical abdomen at this time. Plan to discharge patient home with symptomatic treatment. Discussed results and plan for discharge with patient. Advised patient to follow up with PCP as needed. Discussed return  precautions with patient.  Final Clinical Impressions(s) / ED Diagnoses   Final diagnoses:  Nausea vomiting and diarrhea    New Prescriptions New Prescriptions   ONDANSETRON (ZOFRAN ODT) 4 MG DISINTEGRATING TABLET    Take 1 tablet (4 mg total) by mouth every 8 (eight) hours as needed for nausea or vomiting.     Satira Sark [place], [place] 07/17/16 1243    Courteney Randall An, MD 07/19/16 [phone]

## 2016-07-17 NOTE — Discharge Instructions (Signed)
Take your medications as prescribed as needed for nausea. Continue drinking fluids at home to remain hydrated. I recommend eating a bland diet for the next 2 days and today your symptoms have improved. Follow-up with your primary care provider in the next week if your symptoms have not improved. Please return to the Emergency Department if symptoms worsen or new onset of fever, abdominal pain, vomiting blood, unable to keep fluids down, blood in stool.

## 2017-01-28 ENCOUNTER — Emergency Department (HOSPITAL_COMMUNITY)
Admission: EM | Admit: 2017-01-28 | Discharge: 2017-01-28 | Disposition: A | Discharge status: Home / Self Care | Payer: Self-pay | Attending: Emergency Medicine | Admitting: Emergency Medicine

## 2017-01-28 ENCOUNTER — Encounter (HOSPITAL_COMMUNITY): Payer: Self-pay

## 2017-01-28 ENCOUNTER — Emergency Department (HOSPITAL_COMMUNITY): Payer: Self-pay

## 2017-01-28 DIAGNOSIS — J45909 Unspecified asthma, uncomplicated: Secondary | ICD-10-CM | POA: Insufficient documentation

## 2017-01-28 DIAGNOSIS — J069 Acute upper respiratory infection, unspecified: Secondary | ICD-10-CM | POA: Insufficient documentation

## 2017-01-28 DIAGNOSIS — F17210 Nicotine dependence, cigarettes, uncomplicated: Secondary | ICD-10-CM | POA: Insufficient documentation

## 2017-01-28 DIAGNOSIS — R059 Cough, unspecified: Secondary | ICD-10-CM

## 2017-01-28 DIAGNOSIS — R05 Cough: Secondary | ICD-10-CM

## 2017-01-28 MED ORDER — DEXAMETHASONE 4 MG PO TABS
10.0000 mg | ORAL_TABLET | Freq: Once | ORAL | Status: AC
  Administered 2017-01-28: 10 mg via ORAL
  Filled 2017-01-28: qty 3

## 2017-01-28 MED ORDER — LORATADINE-PSEUDOEPHEDRINE ER 5-120 MG PO TB12: 1.0000 | ORAL_TABLET | Freq: Two times a day (BID) | ORAL | 0 refills | Status: DC

## 2017-01-28 MED ORDER — HYDROCODONE-HOMATROPINE 5-1.5 MG/5ML PO SYRP: 5.0000 mL | ORAL_SOLUTION | Freq: Four times a day (QID) | ORAL | 0 refills | Status: DC | PRN

## 2017-01-28 MED ORDER — HYDROCODONE-HOMATROPINE 5-1.5 MG/5ML PO SYRP
5.0000 mL | ORAL_SOLUTION | Freq: Once | ORAL | Status: AC
Start: 2017-01-28 — End: 2017-01-28
  Administered 2017-01-28: 5 mL via ORAL
  Filled 2017-01-28: qty 5

## 2017-01-28 MED ORDER — ALBUTEROL SULFATE HFA 108 (90 BASE) MCG/ACT IN AERS
2.0000 | INHALATION_SPRAY | Freq: Once | RESPIRATORY_TRACT | Status: AC
  Administered 2017-01-28: 2 via RESPIRATORY_TRACT
  Filled 2017-01-28: qty 6.7

## 2017-01-28 NOTE — ED Triage Notes (Signed)
Patient complains of congestion, runny nose, bodyaches x 1 day. Took nyquil with no relief. Alert and oriented

## 2017-01-28 NOTE — ED Provider Notes (Signed)
[facility] Provider Note   CSN: [id number] Arrival date & time: 01/28/17  1131  By signing my name below, I, Freida Busman, attest that this documentation has been prepared under the direction and in the presence of Marily Memos, MD . Electronically Signed: Freida Busman, Scribe. 01/28/2017. 2:11 PM.   History   Chief Complaint Chief Complaint  Patient presents with  . Nasal Congestion    The history is provided by the patient. No language interpreter was used.    HPI Comments:  Seth Spence is a 26 y.o. male who presents to the Emergency Department complaining of nasal congestion and rhinorrhea since yesterday. Pt states this was preceded by a scratchy throat. He reports associated dry cough today with stabbing chest pain secondary to the cough. He also notes chills, subjective fever, and nausea. He reports recent sick contacts with similar symptoms. He has a  h/o similar symptoms in the past when diagnosed with the flu. No flu shot received this season. Pt is a current 1/2 ppd smoker. He also admits to marijuana use; smokes about a half a joint per day. Denies alcohol use. No vomiting. No alleviating factors noted.    Past Medical History:  Diagnosis Date  . Allergy   . Asthma   . Depression     Patient Active Problem List   Diagnosis Date Noted  . Elevated BP 01/23/2016  . Screening cholesterol level 01/23/2016  . Screening for diabetes mellitus 01/23/2016  . Vitamin D deficiency 01/23/2016  . Medication management 01/23/2016  . Perirectal abscess 04/21/2013    Past Surgical History:  Procedure Laterality Date  . OTHER SURGICAL HISTORY     two years ago, i had abcess lanced       Home Medications    Prior to Admission medications   Medication Sig Start Date End Date Taking? Authorizing Provider  HYDROcodone-homatropine (HYCODAN) 5-1.5 MG/5ML syrup Take 5 mLs by mouth every 6 (six) hours as needed for cough. 01/28/17   Marily Memos, MD    metroNIDAZOLE (FLAGYL) 500 MG tablet Take 500 mg by mouth 2 (two) times daily.    Historical Provider, MD  ondansetron (ZOFRAN ODT) 4 MG disintegrating tablet Take 1 tablet (4 mg total) by mouth every 8 (eight) hours as needed for nausea or vomiting. 07/17/16   Barrett Henle, PA-C    Family History Family History  Problem Relation Age of Onset  . Hypertension Mother   . Depression Mother   . Hypertension Maternal Grandmother     Social History Social History  Substance Use Topics  . Smoking status: Light Tobacco Smoker    Packs/day: 0.25    Types: Cigarettes  . Smokeless tobacco: Never Used  . Alcohol use Yes     Comment: 1 beer per day     Allergies   Patient has no known allergies.   Review of Systems Review of Systems  Constitutional: Positive for fever.  HENT: Positive for congestion and rhinorrhea.   Respiratory: Positive for cough.   Cardiovascular: Positive for chest pain.  Gastrointestinal: Positive for nausea. Negative for vomiting.  All other systems reviewed and are negative.    Physical Exam Updated Vital Signs BP (!) 123/94 (BP Location: Right Arm)   Pulse 70   Temp 98.3 F (36.8 C) (Oral)   Resp 18   SpO2 99%   Physical Exam  Constitutional: He is oriented to person, place, and time. He appears well-developed and well-nourished. No distress.  HENT:  Head: Normocephalic  and atraumatic.  Mouth/Throat: Posterior oropharyngeal erythema present. No oropharyngeal exudate. No tonsillar exudate.  nasal mucosal  edema   frontal sinus tenderness   Eyes: Conjunctivae are normal.  Cardiovascular: Normal rate, regular rhythm and normal heart sounds.   Pulmonary/Chest: Effort normal and breath sounds normal. No respiratory distress. He has no wheezes.  Abdominal: He exhibits no distension.  Lymphadenopathy:    He has no cervical adenopathy.  Neurological: He is alert and oriented to person, place, and time.  Skin: Skin is warm and dry.   Psychiatric: He has a normal mood and affect.  Nursing note and vitals reviewed.    ED Treatments / Results  DIAGNOSTIC STUDIES:  Oxygen Saturation is 99% on RA, normal by my interpretation.    COORDINATION OF CARE:  2:04 PM Discussed treatment plan with pt at bedside and pt agreed to plan.  Labs (all labs ordered are listed, but only abnormal results are displayed) Labs Reviewed - No data to display  EKG  EKG Interpretation None       Radiology Dg Chest 2 View  Result Date: 01/28/2017 CLINICAL DATA:  Dry cough, congestion, fever EXAM: CHEST  2 VIEW COMPARISON:  None. FINDINGS: Normal mediastinum and cardiac silhouette. Normal pulmonary vasculature. No evidence of effusion, infiltrate, or pneumothorax. No acute bony abnormality. IMPRESSION: No acute cardiopulmonary process.  Normal chest radiograph. Electronically Signed   By: Genevive BiStewart  Edmunds M.D.   On: 01/28/2017 14:44    Procedures Procedures (including critical care time)  Medications Ordered in ED Medications  dexamethasone (DECADRON) tablet 10 mg (10 mg Oral Given 01/28/17 1416)  HYDROcodone-homatropine (HYCODAN) 5-1.5 MG/5ML syrup 5 mL (5 mLs Oral Given 01/28/17 1421)  albuterol (PROVENTIL HFA;VENTOLIN HFA) 108 (90 Base) MCG/ACT inhaler 2 puff (2 puffs Inhalation Given 01/28/17 1417)     Initial Impression / Assessment and Plan / ED Course  I have reviewed the triage vital signs and the nursing notes.  Pertinent labs & imaging results that were available during my care of the patient were reviewed by me and considered in my medical decision making (see chart for details).    Likely viral syndrome. Supportive care given. No indication for antibiotics.  Final Clinical Impressions(s) / ED Diagnoses   Final diagnoses:  Cough  Upper respiratory tract infection, unspecified type    New Prescriptions New Prescriptions   HYDROCODONE-HOMATROPINE (HYCODAN) 5-1.5 MG/5ML SYRUP    Take 5 mLs by mouth every 6 (six)  hours as needed for cough.   I personally performed the services described in this documentation, which was scribed in my presence. The recorded information has been reviewed and is accurate.     Marily MemosJason Lizzete Gough, MD 01/28/17 [phone]

## 2017-01-28 NOTE — ED Notes (Signed)
Feeling bad and nasel congestion, dry cough and hurts to cough and cold chills

## 2017-02-28 ENCOUNTER — Encounter: Payer: Self-pay | Admitting: Internal Medicine

## 2017-06-16 ENCOUNTER — Encounter (HOSPITAL_COMMUNITY): Payer: Self-pay | Admitting: Emergency Medicine

## 2017-06-16 ENCOUNTER — Emergency Department (HOSPITAL_COMMUNITY)
Admission: EM | Admit: 2017-06-16 | Discharge: 2017-06-17 | Disposition: A | Discharge status: Home / Self Care | Payer: Managed Care, Other (non HMO) | Attending: Emergency Medicine | Admitting: Emergency Medicine

## 2017-06-16 DIAGNOSIS — N5082 Scrotal pain: Secondary | ICD-10-CM | POA: Insufficient documentation

## 2017-06-16 DIAGNOSIS — Z79899 Other long term (current) drug therapy: Secondary | ICD-10-CM | POA: Insufficient documentation

## 2017-06-16 DIAGNOSIS — J45909 Unspecified asthma, uncomplicated: Secondary | ICD-10-CM | POA: Insufficient documentation

## 2017-06-16 DIAGNOSIS — F17210 Nicotine dependence, cigarettes, uncomplicated: Secondary | ICD-10-CM | POA: Insufficient documentation

## 2017-06-16 DIAGNOSIS — N50812 Left testicular pain: Secondary | ICD-10-CM | POA: Insufficient documentation

## 2017-06-16 DIAGNOSIS — N390 Urinary tract infection, site not specified: Secondary | ICD-10-CM | POA: Insufficient documentation

## 2017-06-16 DIAGNOSIS — R8281 Pyuria: Secondary | ICD-10-CM

## 2017-06-16 MED ORDER — OXYCODONE-ACETAMINOPHEN 5-325 MG PO TABS
2.0000 | ORAL_TABLET | Freq: Once | ORAL | Status: AC
  Administered 2017-06-17: 2 via ORAL
  Filled 2017-06-16: qty 2

## 2017-06-16 NOTE — ED Provider Notes (Signed)
[facility]-EMERGENCY DEPT Provider Note   CSN: [id number] Arrival date & time: 06/16/17  2119     History   Chief Complaint Chief Complaint  Patient presents with  . Groin Pain    HPI Seth Spence is a 26 y.o. male.  Otherwise healthy male with intermittent left testicular pain x 6 months. He has had an abscess remotely in the area that resolved spontaneously. He has taken ibuprofen without relief. Per GF, he has had some scrotal swelling. No penile discharge. He reports mild dysuria. No fever. He drives long distances for work and was concerned about going to work tomorrow with his current level of pain.   The history is provided by the patient.  Groin Pain  Pertinent negatives include no abdominal pain.    Past Medical History:  Diagnosis Date  . Allergy   . Asthma   . Depression     Patient Active Problem List   Diagnosis Date Noted  . Elevated BP 01/23/2016  . Screening cholesterol level 01/23/2016  . Screening for diabetes mellitus 01/23/2016  . Vitamin D deficiency 01/23/2016  . Medication management 01/23/2016  . Perirectal abscess 04/21/2013    Past Surgical History:  Procedure Laterality Date  . OTHER SURGICAL HISTORY     two years ago, i had abcess lanced       Home Medications    Prior to Admission medications   Medication Sig Start Date End Date Taking? Authorizing Provider  HYDROcodone-homatropine (HYCODAN) 5-1.5 MG/5ML syrup Take 5 mLs by mouth every 6 (six) hours as needed for cough. 01/28/17   Mesner, Barbara Cower, MD  loratadine-pseudoephedrine (CLARITIN-D 12 HOUR) 5-120 MG tablet Take 1 tablet by mouth 2 (two) times daily. 01/28/17   Mesner, Barbara Cower, MD  metroNIDAZOLE (FLAGYL) 500 MG tablet Take 500 mg by mouth 2 (two) times daily.    [provider]  ondansetron (ZOFRAN ODT) 4 MG disintegrating tablet Take 1 tablet (4 mg total) by mouth every 8 (eight) hours as needed for nausea or vomiting. 07/17/16   Barrett Henle, PA-C    Family  History Family History  Problem Relation Age of Onset  . Hypertension Mother   . Depression Mother   . Hypertension Maternal Grandmother     Social History Social History  Substance Use Topics  . Smoking status: Light Tobacco Smoker    Packs/day: 0.25    Types: Cigarettes  . Smokeless tobacco: Never Used  . Alcohol use Yes     Comment: 1 beer per day     Allergies   Patient has no known allergies.   Review of Systems Review of Systems  Constitutional: Negative for chills and fever.  Gastrointestinal: Negative.  Negative for abdominal pain.  Genitourinary: Positive for scrotal swelling and testicular pain. Negative for discharge, dysuria and penile pain.  Musculoskeletal: Negative.  Negative for back pain.  Skin: Negative.   Neurological: Negative.      Physical Exam Updated Vital Signs BP 124/79 (BP Location: Right Arm)   Pulse 82   Temp 98.5 F (36.9 C) (Oral)   Resp 18   Ht 5\' 10"  (1.778 m)   Wt 65.8 kg (145 lb)   SpO2 99%   BMI 20.81 kg/m   Physical Exam  Constitutional: He is oriented to person, place, and time. He appears well-developed and well-nourished.  Neck: Normal range of motion.  Cardiovascular: Normal rate and regular rhythm.   No murmur heard. Pulmonary/Chest: Effort normal. He has no wheezes. He has no rales.  Genitourinary:  Genitourinary Comments: Circumcised. No penile discharge. Left testicle is significantly tender - unable to tolerate light touch. No redness. Right testicle nontender. Moderate left scrotal swelling.  Musculoskeletal: Normal range of motion.  Lymphadenopathy:       Left: Inguinal adenopathy present.  Neurological: He is alert and oriented to person, place, and time.  Skin: Skin is warm and dry.  Psychiatric: He has a normal mood and affect.     ED Treatments / Results  Labs (all labs ordered are listed, but only abnormal results are displayed) Labs Reviewed  URINALYSIS, ROUTINE W REFLEX MICROSCOPIC    GC/CHLAMYDIA PROBE AMP ([place]) NOT AT [facility]    EKG  EKG Interpretation None       Radiology No results found.  Procedures Procedures (including critical care time)  Medications Ordered in ED Medications - No data to display   Initial Impression / Assessment and Plan / ED Course  I have reviewed the triage vital signs and the nursing notes.  Pertinent labs & imaging results that were available during my care of the patient were reviewed by me and considered in my medical decision making (see chart for details).     Patient here for evaluation of severe left testicular pain. He has had intermittent symptoms for several months, but today pain has been unbearable. No dysuria, penile discharge. He reports scrotal abscess in the past that drained spontaneously.  On exam, there is significant tenderness. Difficult to get a full exam because of pain. US ordered to evaluate for torsion vs abscess and is negative for both torsion or fluid collection concerning for abscess. UA significant for TNTC WBCs without bacteria. Will treat with Doxycycline to cover for both STD as well as possibility of early abscess.  Refer to urology for further evaluation if symptoms persist or recur.   Final Clinical Impressions(s) / ED Diagnoses   Final diagnoses:  None   1. Left testicular pain 2. Pyuria  New Prescriptions New Prescriptions   No medications on file     Danne HarborUpstill, Abigal Choung, PA-C 06/17/17 2322    Devoria AlbeKnapp, Iva, MD 06/20/17 2255

## 2017-06-16 NOTE — ED Triage Notes (Addendum)
Pt reports having new onset of left testicle swelling that started yesterday. Pt states that he has prior hx of abscess in area. Pt denies any injury. Pt reports pain on urination as well.

## 2017-06-17 ENCOUNTER — Emergency Department (HOSPITAL_COMMUNITY): Payer: Managed Care, Other (non HMO)

## 2017-06-17 LAB — URINALYSIS, ROUTINE W REFLEX MICROSCOPIC
BACTERIA UA: NONE SEEN
BILIRUBIN URINE: NEGATIVE
Glucose, UA: NEGATIVE mg/dL
KETONES UR: 5 mg/dL — AB
Nitrite: NEGATIVE
PROTEIN: NEGATIVE mg/dL
Specific Gravity, Urine: 1.017 (ref 1.005–1.030)
pH: 6 (ref 5.0–8.0)

## 2017-06-17 LAB — GC/CHLAMYDIA PROBE AMP (~~LOC~~) NOT AT ARMC
CHLAMYDIA, DNA PROBE: NEGATIVE
NEISSERIA GONORRHEA: NEGATIVE

## 2017-06-17 MED ORDER — OXYCODONE-ACETAMINOPHEN 5-325 MG PO TABS: 1.0000 | ORAL_TABLET | ORAL | 0 refills | Status: DC | PRN

## 2017-06-17 MED ORDER — DOXYCYCLINE HYCLATE 100 MG PO CAPS: 100.0000 mg | ORAL_CAPSULE | Freq: Two times a day (BID) | ORAL | 0 refills | Status: DC

## 2017-06-17 MED ORDER — DOXYCYCLINE HYCLATE 100 MG PO TABS
100.0000 mg | ORAL_TABLET | Freq: Once | ORAL | Status: AC
  Administered 2017-06-17: 100 mg via ORAL
  Filled 2017-06-17: qty 1

## 2017-06-17 MED ORDER — NICOTINE 21 MG/24HR TD PT24
21.0000 mg | MEDICATED_PATCH | Freq: Every day | TRANSDERMAL | Status: DC
  Administered 2017-06-17: 21 mg via TRANSDERMAL
  Filled 2017-06-17: qty 1

## 2017-06-17 NOTE — Discharge Instructions (Signed)
Take the medications as prescribed for pain and to cover for infection. If symptoms worsen or are persistent without improvement, follow up with urology by calling the office to schedule an appointment. Return to the emergency room with any new or concerning symptoms.

## 2017-12-11 ENCOUNTER — Emergency Department (HOSPITAL_COMMUNITY)
Admission: EM | Admit: 2017-12-11 | Discharge: 2017-12-11 | Discharge status: Against Medical Advice | Payer: Managed Care, Other (non HMO)

## 2017-12-11 DIAGNOSIS — N492 Inflammatory disorders of scrotum: Secondary | ICD-10-CM | POA: Insufficient documentation

## 2017-12-11 DIAGNOSIS — L0291 Cutaneous abscess, unspecified: Secondary | ICD-10-CM | POA: Insufficient documentation

## 2017-12-11 DIAGNOSIS — F17210 Nicotine dependence, cigarettes, uncomplicated: Secondary | ICD-10-CM | POA: Insufficient documentation

## 2017-12-12 ENCOUNTER — Encounter (HOSPITAL_COMMUNITY): Payer: Self-pay | Admitting: Emergency Medicine

## 2017-12-12 ENCOUNTER — Emergency Department (HOSPITAL_COMMUNITY)
Admission: EM | Admit: 2017-12-12 | Discharge: 2017-12-12 | Disposition: A | Discharge status: Home / Self Care | Payer: Self-pay | Attending: Emergency Medicine | Admitting: Emergency Medicine

## 2017-12-12 ENCOUNTER — Other Ambulatory Visit: Payer: Self-pay

## 2017-12-12 ENCOUNTER — Emergency Department (HOSPITAL_COMMUNITY): Payer: Self-pay

## 2017-12-12 DIAGNOSIS — N492 Inflammatory disorders of scrotum: Secondary | ICD-10-CM

## 2017-12-12 DIAGNOSIS — L0291 Cutaneous abscess, unspecified: Secondary | ICD-10-CM

## 2017-12-12 LAB — CBC WITH DIFFERENTIAL/PLATELET
BASOS ABS: 0 10*3/uL (ref 0.0–0.1)
Basophils Relative: 0 %
EOS PCT: 0 %
Eosinophils Absolute: 0 10*3/uL (ref 0.0–0.7)
HEMATOCRIT: 38.1 % — AB (ref 39.0–52.0)
HEMOGLOBIN: 13 g/dL (ref 13.0–17.0)
LYMPHS ABS: 1.8 10*3/uL (ref 0.7–4.0)
LYMPHS PCT: 9 %
MCH: 30.4 pg (ref 26.0–34.0)
MCHC: 34.1 g/dL (ref 30.0–36.0)
MCV: 89.2 fL (ref 78.0–100.0)
Monocytes Absolute: 1.3 10*3/uL — ABNORMAL HIGH (ref 0.1–1.0)
Monocytes Relative: 7 %
NEUTROS ABS: 16.9 10*3/uL — AB (ref 1.7–7.7)
NEUTROS PCT: 84 %
Platelets: 222 10*3/uL (ref 150–400)
RBC: 4.27 MIL/uL (ref 4.22–5.81)
RDW: 13.5 % (ref 11.5–15.5)
WBC: 20 10*3/uL — ABNORMAL HIGH (ref 4.0–10.5)

## 2017-12-12 LAB — COMPREHENSIVE METABOLIC PANEL
ALK PHOS: 63 U/L (ref 38–126)
ALT: 12 U/L — AB (ref 17–63)
AST: 17 U/L (ref 15–41)
Albumin: 4.2 g/dL (ref 3.5–5.0)
Anion gap: 8 (ref 5–15)
BILIRUBIN TOTAL: 0.7 mg/dL (ref 0.3–1.2)
BUN: 9 mg/dL (ref 6–20)
CALCIUM: 9 mg/dL (ref 8.9–10.3)
CHLORIDE: 104 mmol/L (ref 101–111)
CO2: 25 mmol/L (ref 22–32)
CREATININE: 0.77 mg/dL (ref 0.61–1.24)
Glucose, Bld: 121 mg/dL — ABNORMAL HIGH (ref 65–99)
Potassium: 3.7 mmol/L (ref 3.5–5.1)
Sodium: 137 mmol/L (ref 135–145)
Total Protein: 7.4 g/dL (ref 6.5–8.1)

## 2017-12-12 LAB — I-STAT CG4 LACTIC ACID, ED
LACTIC ACID, VENOUS: 0.48 mmol/L — AB (ref 0.5–1.9)
Lactic Acid, Venous: 0.54 mmol/L (ref 0.5–1.9)

## 2017-12-12 MED ORDER — OXYCODONE-ACETAMINOPHEN 5-325 MG PO TABS
2.0000 | ORAL_TABLET | Freq: Once | ORAL | Status: AC
Start: 2017-12-12 — End: 2017-12-12
  Administered 2017-12-12: 2 via ORAL
  Filled 2017-12-12: qty 2

## 2017-12-12 MED ORDER — OXYCODONE-ACETAMINOPHEN 5-325 MG PO TABS: 1.0000 | ORAL_TABLET | Freq: Four times a day (QID) | ORAL | 0 refills | Status: DC | PRN

## 2017-12-12 MED ORDER — CLINDAMYCIN PHOSPHATE 600 MG/50ML IV SOLN
600.0000 mg | Freq: Once | INTRAVENOUS | Status: AC
  Administered 2017-12-12: 600 mg via INTRAVENOUS
  Filled 2017-12-12: qty 50

## 2017-12-12 MED ORDER — SODIUM CHLORIDE 0.9 % IV BOLUS (SEPSIS)
1000.0000 mL | Freq: Once | INTRAVENOUS | Status: AC
  Administered 2017-12-12: 1000 mL via INTRAVENOUS

## 2017-12-12 MED ORDER — FENTANYL CITRATE (PF) 100 MCG/2ML IJ SOLN
50.0000 ug | Freq: Once | INTRAMUSCULAR | Status: AC
  Administered 2017-12-12: 50 ug via INTRAVENOUS
  Filled 2017-12-12: qty 2

## 2017-12-12 MED ORDER — CLINDAMYCIN HCL 150 MG PO CAPS: 450.0000 mg | ORAL_CAPSULE | Freq: Three times a day (TID) | ORAL | 0 refills | Status: DC

## 2017-12-12 MED ORDER — LIDOCAINE-EPINEPHRINE (PF) 2 %-1:200000 IJ SOLN
20.0000 mL | Freq: Once | INTRAMUSCULAR | Status: AC
  Administered 2017-12-12: 20 mL
  Filled 2017-12-12: qty 20

## 2017-12-12 NOTE — Consult Note (Signed)
Consult: scrotal abscess Requested by: Dr. Paula LibraJohn Spence   History of Present Illness: 27 yo male with h/o scrotal erythema, swelling and pain worsening over last 5 days. Exam and bedside US c/w abscess. The testicles appeared normal. I reviewed the images. Patient reports similar episodes and what sounds like the perineum or perirectal abscess I&D.  Modifying factors: There are no other modifying factors  Associated signs and symptoms: There are no other associated signs and symptoms Aggravating and relieving factors: There are no other aggravating or relieving factors Severity: Moderate Duration: Persistent   Past Medical History:  Diagnosis Date  . Allergy   . Asthma   . Depression    Past Surgical History:  Procedure Laterality Date  . OTHER SURGICAL HISTORY     two years ago, i had abcess lanced    Home Medications:   (Not in a hospital admission) Allergies: No Known Allergies  Family History  Problem Relation Age of Onset  . Hypertension Mother   . Depression Mother   . Hypertension Maternal Grandmother    Social History:  reports that he has been smoking cigarettes.  He has been smoking about 0.25 packs per day. he has never used smokeless tobacco. He reports that he drinks alcohol. He reports that he does not use drugs.  ROS: A complete review of systems was performed.  All systems are negative except for pertinent findings as noted. Review of Systems  All other systems reviewed and are negative.    Physical Exam:  Vital signs in last 24 hours: Temp:  [99.8 F (37.7 C)] 99.8 F (37.7 C) (02/01 0210) Pulse Rate:  [77] 77 (02/01 0210) Resp:  [18] 18 (02/01 0210) BP: (112)/(85) 112/85 (02/01 0210) SpO2:  [99 %] 99 % (02/01 0210) General:  Alert and oriented, No acute distress Neck: No JVD or lymphadenopathy Cardiovascular: Regular rate and rhythm Lungs: Regular rate and effort Abdomen: Soft, nontender, nondistended, no abdominal masses Extremities: No  edema Neurologic: Grossly intact GU: The penis is circumcised without mass or lesion.  The meatus is normal without discharge.  The testicles are palpably normal without mass.  The scrotum is normal apart from a fluctuant area superior left scrotum where there is an obvious area of fluctuance and surrounding induration.  There is mild erythema of the skin but no necrosis.  Procedure: I discussed with the patient and his wife the nature, potential benefits, risks and alternatives to scrotal abscess incision and drainage and wound packing, including side effects of the proposed treatment, the likelihood of the patient achieving the goals of the procedure, and any potential problems that might occur during the procedure or recuperation. All questions answered. Patient elects to proceed. The ultrasound jelly was wiped off the left hemiscrotum and it was prepped with Betadine and draped in the usual sterile fashion.  A drip lidocaine with epi on the skin and let it sit for a minute. Lidocaine with epi was then injected into the skin over the fluctuant area for about 3 cm.  A 2 cm incision was made and foul-smelling pus drained from the abscess cavity.  Although I had asked for it, there were no culture swabs in the supplies.  The wound was irrigated with saline.  I passed a finger superiorly for about a centimeter and inferiorly for about 2 cm breaking up any loculated pockets.  The wound was irrigated. A moist wet-to-dry 4 x 4 was tucked down into the incision but it only took about a third  of this.  He was cleaned up and sterile gauze was placed over the packing.  He tolerated the procedure well although he had some pain with packing.  Blood loss was minimal.  There were no complications.   Laboratory Data:  Results for orders placed or performed during the hospital encounter of 12/12/17 (from the past 24 hour(s))  CBC with Differential     Status: Abnormal   Collection Time: 12/12/17  4:30 AM  Result Value  Ref Range   WBC 20.0 (H) 4.0 - 10.5 K/uL   RBC 4.27 4.22 - 5.81 MIL/uL   Hemoglobin 13.0 13.0 - 17.0 g/dL   HCT 16.1 (L) 09.6 - 04.5 %   MCV 89.2 78.0 - 100.0 fL   MCH 30.4 26.0 - 34.0 pg   MCHC 34.1 30.0 - 36.0 g/dL   RDW 40.9 81.1 - 91.4 %   Platelets 222 150 - 400 K/uL   Neutrophils Relative % 84 %   Neutro Abs 16.9 (H) 1.7 - 7.7 K/uL   Lymphocytes Relative 9 %   Lymphs Abs 1.8 0.7 - 4.0 K/uL   Monocytes Relative 7 %   Monocytes Absolute 1.3 (H) 0.1 - 1.0 K/uL   Eosinophils Relative 0 %   Eosinophils Absolute 0.0 0.0 - 0.7 K/uL   Basophils Relative 0 %   Basophils Absolute 0.0 0.0 - 0.1 K/uL   No results found for this or any previous visit (from the past 240 hour(s)). Creatinine: No results for input(s): CREATININE in the last 168 hours.  Impression/Assessment/Plan:  Scrotal abscess status post I&D-discussed with PA Dahlia Client to d/c patient on some antibiotics such as Bactrim or clindamycin.  I would think 5-7 days should suffice.  He can have limited pain medication such as 12-15 tablets.  I showed the patient and his wife during the procedure without to moisten the gauze and pack the wound.  They can change that daily and tuck some gauze down in the incision to keep it open.  Iodoform would work well, too. We will plan to see him back next week for wound check. Return precautions discussed with patient and his wife and contact information given.  Jerilee Field 12/12/2017, 5:23 AM

## 2017-12-12 NOTE — ED Notes (Signed)
US at bedside

## 2017-12-12 NOTE — ED Triage Notes (Signed)
Pt arriving with abscess in the groin area present since Tuesday.

## 2017-12-12 NOTE — Discharge Instructions (Signed)
1. Medications: Oxycodone for pain, clindamycin, usual home medications 2. Treatment: rest, drink plenty of fluids, use warm compresses, flush abscess with warm water several times per day.  Replace packing daily as directed by removing the old packing/gauze and tucking clean moist gauze into the incision and cavity to keep the incision open. Cover with fresh/clean gauze.  3. Follow Up: Please followup with Dr. Mena GoesEskridge on Monday as directed; Please return to the ER for fevers, chills, nausea, vomiting or other signs of infection

## 2017-12-12 NOTE — ED Provider Notes (Signed)
[facility]-EMERGENCY DEPT Provider Note   CSN: [id number] Arrival date & time: 12/11/17  2250     History   Chief Complaint Chief Complaint  Patient presents with  . Abscess    Groin    HPI Seth Spence is a 27 y.o. male with a hx of asthma, allergies, depression presents to the Emergency Department complaining of gradual, persistent, progressively worsening scrotal abscess onset 5 days ago.  Patient denies drainage from the site.  He states his pain is 10/10.  Patient reports he has had one similarly in the past that opened on its own, but this 1 is somewhat bigger.  Patient denies fevers or chills, nausea or vomiting.  He denies abdominal pain, dysuria, hematuria.  Patient denies known trauma to the area.  No treatments prior to arrival.  Nothing makes it better or worse.     The history is provided by the patient, medical records and a significant other. No language interpreter was used.    Past Medical History:  Diagnosis Date  . Allergy   . Asthma   . Depression     Patient Active Problem List   Diagnosis Date Noted  . Elevated BP 01/23/2016  . Screening cholesterol level 01/23/2016  . Screening for diabetes mellitus 01/23/2016  . Vitamin D deficiency 01/23/2016  . Medication management 01/23/2016  . Perirectal abscess 04/21/2013    Past Surgical History:  Procedure Laterality Date  . OTHER SURGICAL HISTORY     two years ago, i had abcess lanced       Home Medications    Prior to Admission medications   Medication Sig Start Date End Date Taking? Authorizing Provider  ibuprofen (ADVIL,MOTRIN) 800 MG tablet Take 800 mg by mouth every 8 (eight) hours as needed for moderate pain.   Yes [provider]  clindamycin (CLEOCIN) 150 MG capsule Take 3 capsules (450 mg total) by mouth 3 (three) times daily. 12/12/17   Fishel Wamble, Dahlia Client, PA-C  oxyCODONE-acetaminophen (PERCOCET/ROXICET) 5-325 MG tablet Take 1-2 tablets by mouth every  6 (six) hours as needed for severe pain. 12/12/17   Keshawna Dix, Dahlia Client, PA-C    Family History Family History  Problem Relation Age of Onset  . Hypertension Mother   . Depression Mother   . Hypertension Maternal Grandmother     Social History Social History   Tobacco Use  . Smoking status: Light Tobacco Smoker    Packs/day: 0.25    Types: Cigarettes  . Smokeless tobacco: Never Used  Substance Use Topics  . Alcohol use: Yes    Comment: 1 beer per day  . Drug use: No     Allergies   Patient has no known allergies.   Review of Systems Review of Systems  Constitutional: Negative for appetite change, diaphoresis, fatigue, fever and unexpected weight change.  HENT: Negative for mouth sores.   Eyes: Negative for visual disturbance.  Respiratory: Negative for cough, chest tightness, shortness of breath and wheezing.   Cardiovascular: Negative for chest pain.  Gastrointestinal: Negative for abdominal pain, constipation, diarrhea, nausea and vomiting.  Endocrine: Negative for polydipsia, polyphagia and polyuria.  Genitourinary: Positive for scrotal swelling. Negative for dysuria, frequency, hematuria and urgency.  Musculoskeletal: Negative for back pain and neck stiffness.  Skin: Negative for rash.  Allergic/Immunologic: Negative for immunocompromised state.  Neurological: Negative for syncope, light-headedness and headaches.  Hematological: Does not bruise/bleed easily.  Psychiatric/Behavioral: Negative for sleep disturbance. The patient is not nervous/anxious.      Physical  Exam Updated Vital Signs BP 112/85 (BP Location: Left Arm)   Pulse 77   Temp 99.8 F (37.7 C) (Oral)   Resp 18   SpO2 99%   Physical Exam  Constitutional: He appears well-developed and well-nourished. No distress.  Awake, alert, nontoxic appearance  HENT:  Head: Normocephalic and atraumatic.  Mouth/Throat: Oropharynx is clear and moist. No oropharyngeal exudate.  Eyes: Conjunctivae are  normal. No scleral icterus.  Neck: Normal range of motion. Neck supple.  Cardiovascular: Normal rate, regular rhythm and intact distal pulses.  Pulmonary/Chest: Effort normal and breath sounds normal. No respiratory distress. He has no wheezes.  Equal chest expansion  Abdominal: Soft. Bowel sounds are normal. He exhibits no mass. There is no tenderness. There is no rebound and no guarding. Hernia confirmed negative in the right inguinal area and confirmed negative in the left inguinal area.  Genitourinary: Right testis shows no mass, no swelling and no tenderness. Right testis is descended. Left testis shows mass. Left testis shows no swelling and no tenderness. Left testis is descended.  Genitourinary Comments: Chaperone present Large area of induration and fluctuance to the left side of the scrotum and inguinal region.  Site is exquisitely tender.  Both testicles are palpable and nontender.  Musculoskeletal: Normal range of motion. He exhibits no edema.  Lymphadenopathy: Inguinal adenopathy noted on the left side. No inguinal adenopathy noted on the right side.  Neurological: He is alert.  Speech is clear and goal oriented Moves extremities without ataxia  Skin: Skin is warm and dry. He is not diaphoretic. There is erythema.  Patient is tearful during the exam of the scrotum  Psychiatric: He has a normal mood and affect.  Nursing note and vitals reviewed.    ED Treatments / Results  Labs (all labs ordered are listed, but only abnormal results are displayed) Labs Reviewed  CBC WITH DIFFERENTIAL/PLATELET - Abnormal; Notable for the following components:      Result Value   WBC 20.0 (*)    HCT 38.1 (*)    Neutro Abs 16.9 (*)    Monocytes Absolute 1.3 (*)    All other components within normal limits  COMPREHENSIVE METABOLIC PANEL - Abnormal; Notable for the following components:   Glucose, Bld 121 (*)    ALT 12 (*)    All other components within normal limits  I-STAT CG4 LACTIC  ACID, ED  I-STAT CG4 LACTIC ACID, ED     Radiology US Scrotum  Result Date: 12/12/2017 CLINICAL DATA:  Left testicular pain since Tuesday. EXAM: SCROTAL ULTRASOUND DOPPLER ULTRASOUND OF THE TESTICLES TECHNIQUE: Complete ultrasound examination of the testicles, epididymis, and other scrotal structures was performed. Color and spectral Doppler ultrasound were also utilized to evaluate blood flow to the testicles. COMPARISON:  06/17/2017 FINDINGS: Right testicle Measurements: 5.2 x 2.5 x 3.1 cm. No mass or microlithiasis visualized. Left testicle Measurements: 5.9 x 3.5 x 3.5 cm. No mass or microlithiasis visualized. Right epididymis:  Normal in size and appearance. Left epididymis:  Normal in size and appearance. Hydrocele:  None visualized. Varicocele:  None visualized. Pulsed Doppler interrogation of both testes demonstrates normal low resistance arterial and venous waveforms bilaterally. Other: There is a complex fluid collection in the left scrotum lateral to the testis measuring 5.1 x 2.5 x 3.3 cm. There is peripheral flow demonstrated. Appearance is consistent with a scrotal abscess. There is associated scrotal skin thickening. IMPRESSION: Complex collection with peripheral flow demonstrated in the left scrotum laterally with scrotal skin thickening consistent with  scrotal abscess. Hematoma could also have this appearance. No evidence of testicular mass or torsion. Electronically Signed   By: Burman NievesWilliam  Stevens M.D.   On: 12/12/2017 05:41   [place] Scrotom Doppler  Result Date: 12/12/2017 CLINICAL DATA:  Left testicular pain since Tuesday. EXAM: SCROTAL ULTRASOUND DOPPLER ULTRASOUND OF THE TESTICLES TECHNIQUE: Complete ultrasound examination of the testicles, epididymis, and other scrotal structures was performed. Color and spectral Doppler ultrasound were also utilized to evaluate blood flow to the testicles. COMPARISON:  06/17/2017 FINDINGS: Right testicle Measurements: 5.2 x 2.5 x 3.1 cm. No mass or  microlithiasis visualized. Left testicle Measurements: 5.9 x 3.5 x 3.5 cm. No mass or microlithiasis visualized. Right epididymis:  Normal in size and appearance. Left epididymis:  Normal in size and appearance. Hydrocele:  None visualized. Varicocele:  None visualized. Pulsed Doppler interrogation of both testes demonstrates normal low resistance arterial and venous waveforms bilaterally. Other: There is a complex fluid collection in the left scrotum lateral to the testis measuring 5.1 x 2.5 x 3.3 cm. There is peripheral flow demonstrated. Appearance is consistent with a scrotal abscess. There is associated scrotal skin thickening. IMPRESSION: Complex collection with peripheral flow demonstrated in the left scrotum laterally with scrotal skin thickening consistent with scrotal abscess. Hematoma could also have this appearance. No evidence of testicular mass or torsion. Electronically Signed   By: Burman NievesWilliam  Stevens M.D.   On: 12/12/2017 05:41    Procedures Procedures (including critical care time)  EMERGENCY DEPARTMENT US SOFT TISSUE INTERPRETATION "Study: Limited Soft Tissue Ultrasound"  INDICATIONS: Pain and Soft tissue infection Multiple views of the body part were obtained in real-time with a multi-frequency linear probe  PERFORMED BY: Myself IMAGES ARCHIVED?: Yes SIDE:Left BODY PART:Scrotum INTERPRETATION:  Abcess present and Cellulitis present                 Medications Ordered in ED Medications  oxyCODONE-acetaminophen (PERCOCET/ROXICET) 5-325 MG per tablet 2 tablet (2 tablets Oral Given 12/12/17 0333)  sodium chloride 0.9 % bolus 1,000 mL (0 mLs Intravenous Stopped 12/12/17 0514)  clindamycin (CLEOCIN) IVPB 600 mg (0 mg Intravenous Stopped 12/12/17 0530)  fentaNYL (SUBLIMAZE) injection 50 mcg (50 mcg Intravenous Given 12/12/17 0451)  lidocaine-EPINEPHrine (XYLOCAINE W/EPI) 2 %-1:200000 (PF) injection 20 mL (20 mLs Infiltration Given 12/12/17 0605)  fentaNYL (SUBLIMAZE) injection  50 mcg (50 mcg Intravenous Given 12/12/17 0618)     Initial Impression / Assessment and Plan / ED Course  I have reviewed the triage vital signs and the nursing notes.  Pertinent labs & imaging results that were available during my care of the patient were reviewed by me and considered in my medical decision making (see chart for details).  Clinical Course as of Dec 12 621  Fri Dec 12, 2017  0401 Discussed with Dr. Mena GoesEskridge who will evaluate this AM.  Will obtain bedside and formal ultrasound.  [HM]  0608 Dr. Mena GoesEskridge has completed I&D of scrotal abscess.  Patient may be discharged home with pain medication and antibiotics.  Dr. Mena GoesEskridge will see patient in the office on Monday for reevaluation.  [HM]  0615 Leukocytosis elevated some from baseline; however, patient appears to chronically have a leukocytosis. WBC: (!) 20.0 [HM]  0617 Not anemic Hemoglobin: 13.0 [HM]  0617 Normal platelets Platelets: 222 [HM]  0620 Narcotic database access.  Last prescription of oxycodone was August 2018 and tramadol was November 2018.  [HM]    Clinical Course User Index [HM] Makhia Vosler, Boyd KerbsHannah, PA-C    Patient presents with large  left-sided scrotal abscess.  He is afebrile and does not meet sepsis criteria.  Bedside ultrasound reveals a large scrotal abscess.  Discussed with urology who will evaluate patient.  Labs with leukocytosis but no other abnormalities.  Formal ultrasound confirms a large scrotal abscess noted on bedside ultrasound.  Patient given multiple doses of pain medications and some fluids.  IV clindamycin also given.  6:17 AM Urology has performed I&D without reported complication.  Will discharge home with clindamycin and pain medicine.  Patient is to follow with urology on Monday morning for wound check and further evaluation.  Discussed with patient the reasons to return immediately to the emergency department including high fevers, persistent vomiting, spreading infection or other  concerns.  Patient and significant other state understanding and are in agreement with this plan.  The patient was discussed with and seen by Dr. Read Drivers who agrees with the treatment plan.   Final Clinical Impressions(s) / ED Diagnoses   Final diagnoses:  Abscess  Scrotal abscess    ED Discharge Orders        Ordered    oxyCODONE-acetaminophen (PERCOCET/ROXICET) 5-325 MG tablet  Every 6 hours PRN     12/12/17 0621    clindamycin (CLEOCIN) 150 MG capsule  3 times daily     12/12/17 0621       Sebastiana Wuest, Dahlia Client, PA-C 12/12/17 0623    Molpus, Jonny Ruiz, MD 12/12/17 [phone]

## 2017-12-12 NOTE — ED Notes (Signed)
RN stated to writer that she would obtain labs at the start of IV.  

## 2018-03-25 ENCOUNTER — Encounter: Payer: Self-pay | Admitting: Internal Medicine

## 2018-06-14 IMAGING — US US SCROTUM W/ DOPPLER COMPLETE
1 series · 13 of 25 positions shown · non-contrast
Comparison: 06/17/2017

CLINICAL DATA: Left testicular pain since [REDACTED].

EXAM:
SCROTAL ULTRASOUND
DOPPLER ULTRASOUND OF THE TESTICLES
TECHNIQUE: Complete ultrasound examination of the testicles, epididymis, and
other scrotal structures was performed. Color and spectral Doppler
ultrasound were also utilized to evaluate blood flow to the
testicles.

[Series 1: us scrotum w/ doppler complete · 0.07mm/px · 13 of 47 slices shown]
[im 1/47]
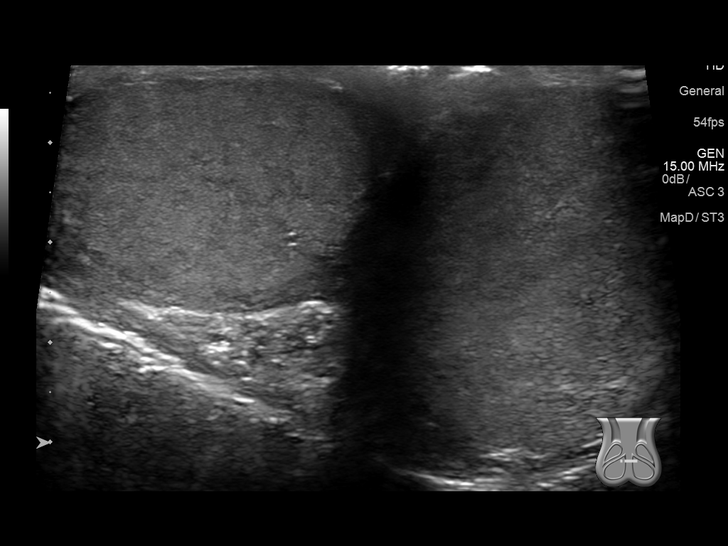
[im 4/47]
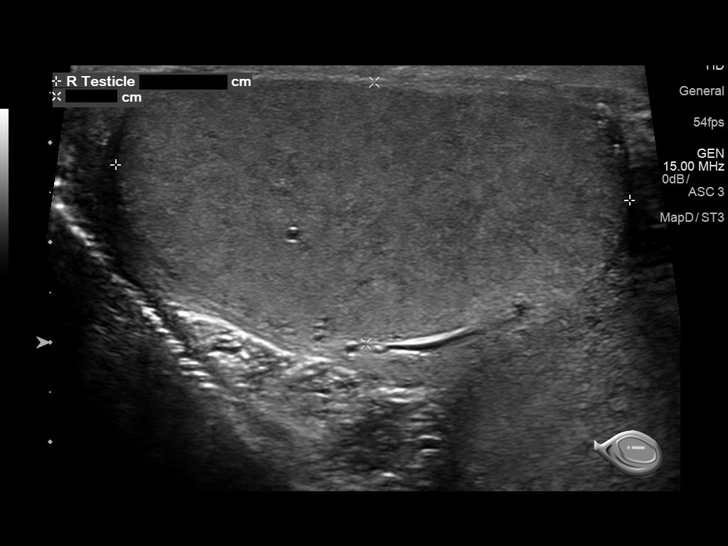
[im 8/47]
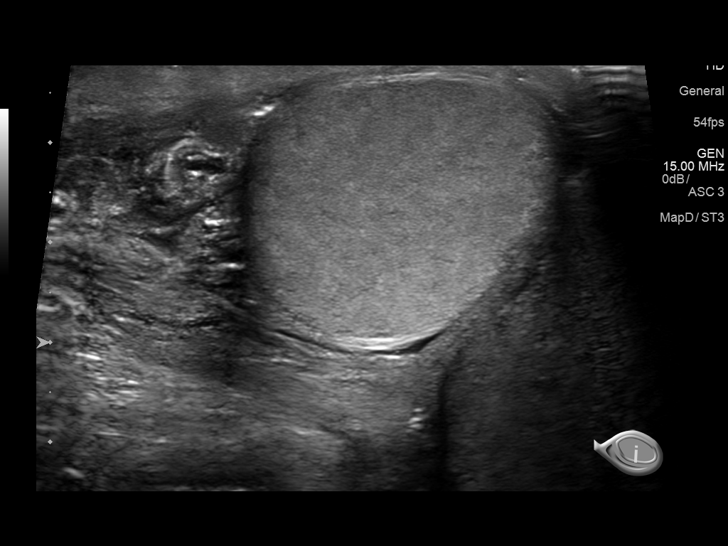
[im 12/47]
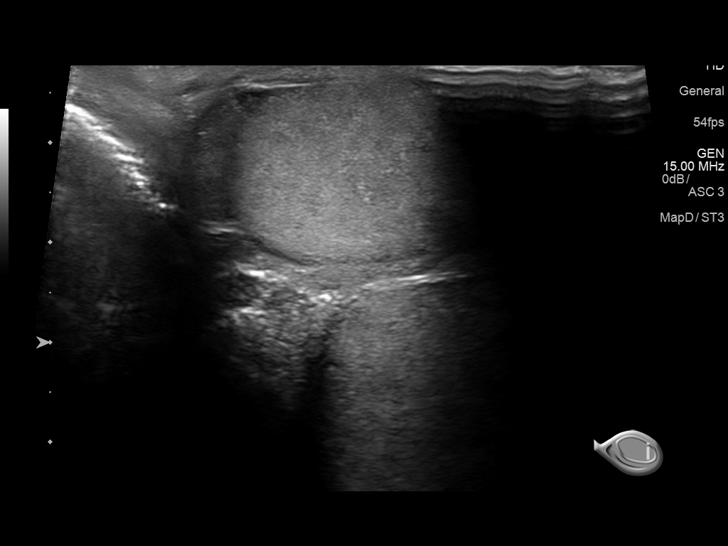
[im 16/47]
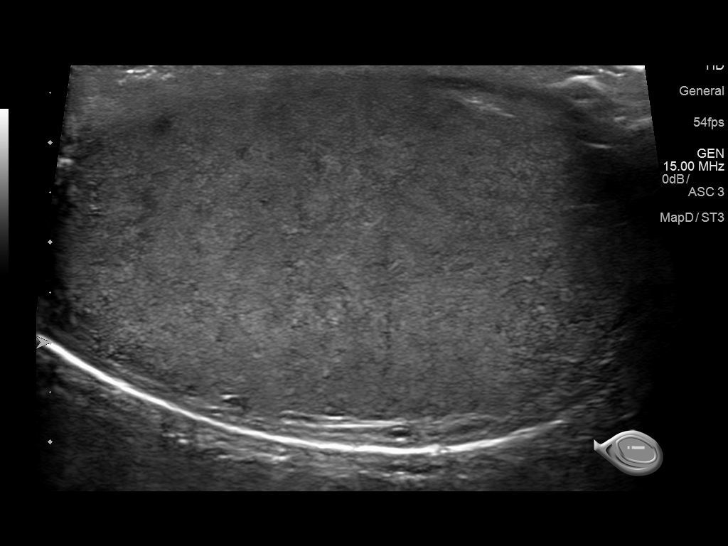
[im 20/47]
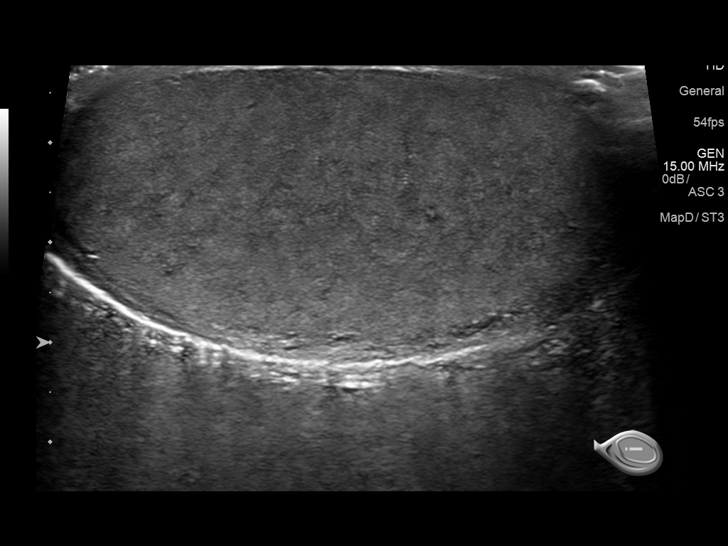
[im 24/47]
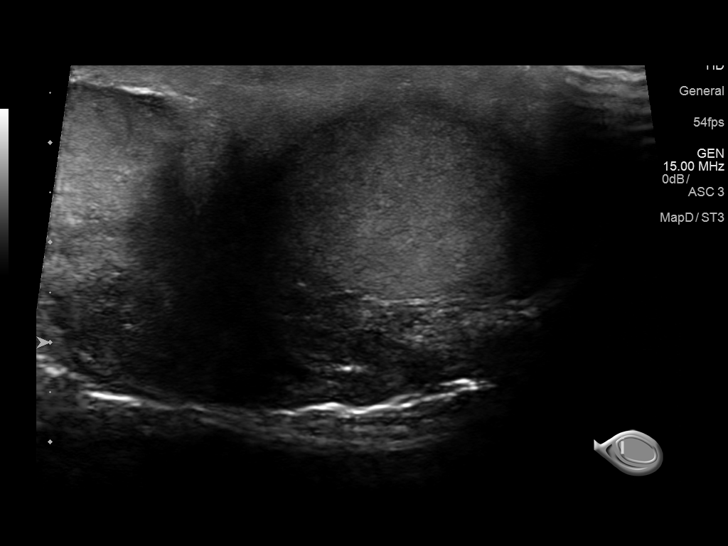
[im 27/47]
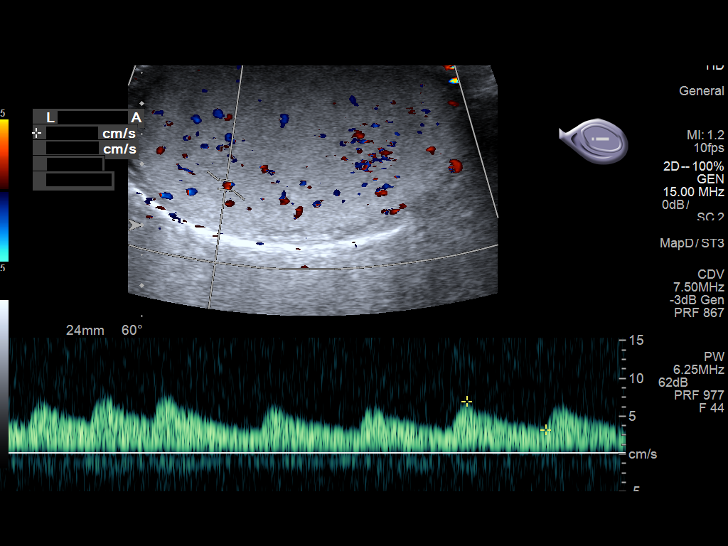
[im 31/47]
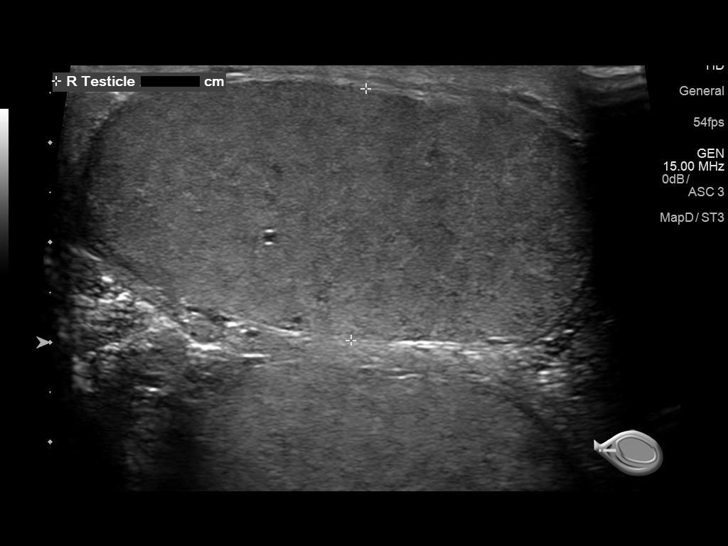
[im 35/47]
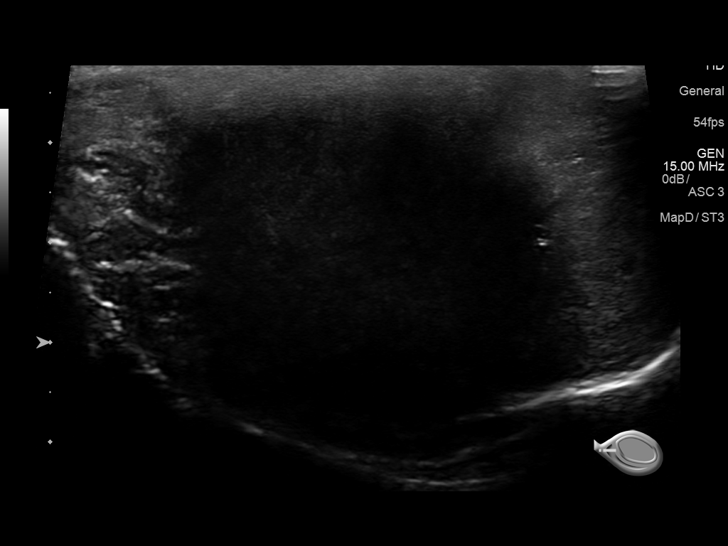
[im 39/47]
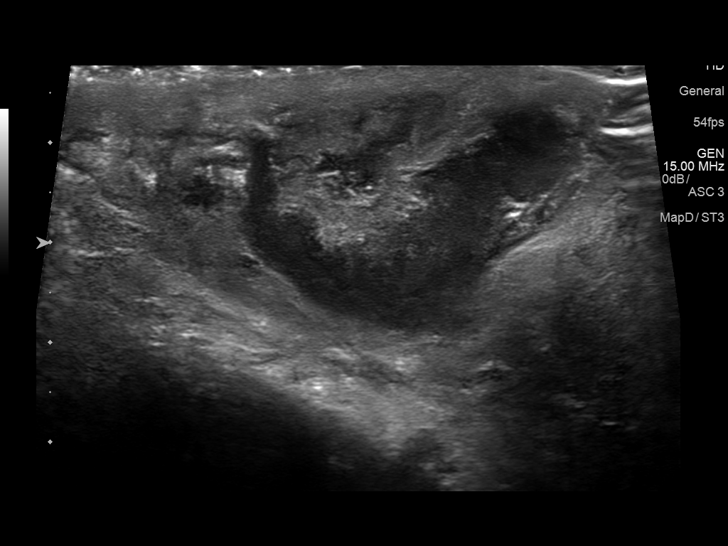
[im 43/47]
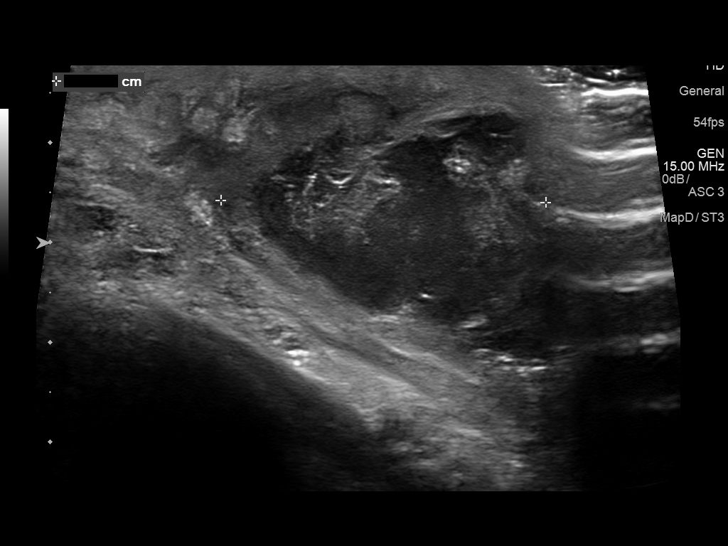
[im 47/47]
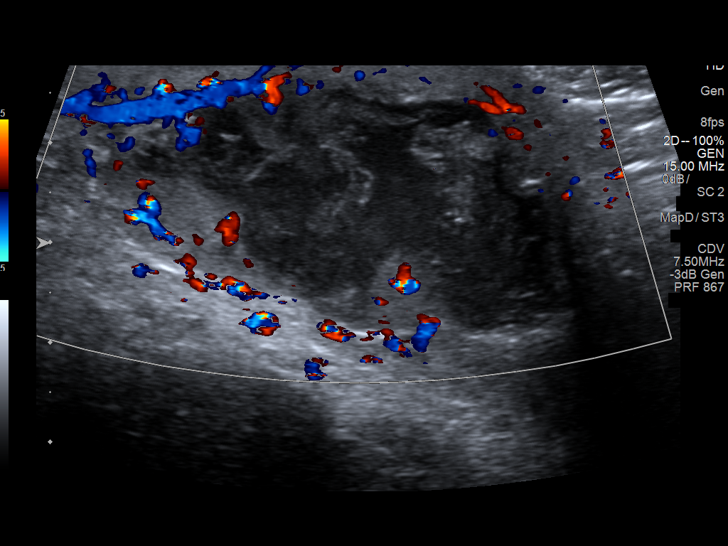

[13 of 25 positions shown; findings below may reference images not displayed]

FINDINGS: Right testicle

Measurements: 5.2 x 2.5 x 3.1 cm. No mass or microlithiasis
visualized.

Left testicle

Measurements: 5.9 x 3.5 x 3.5 cm. No mass or microlithiasis
visualized.

Right epididymis:  Normal in size and appearance.

Left epididymis:  Normal in size and appearance.

Hydrocele:  None visualized.

Varicocele:  None visualized.

Pulsed Doppler interrogation of both testes demonstrates normal low
resistance arterial and venous waveforms bilaterally.

Other: There is a complex fluid collection in the left scrotum
lateral to the testis measuring 5.1 x 2.5 x 3.3 cm. There is
peripheral flow demonstrated. Appearance is consistent with a
scrotal abscess. There is associated scrotal skin thickening.
IMPRESSION: Complex collection with peripheral flow demonstrated in the left
scrotum laterally with scrotal skin thickening consistent with
scrotal abscess. Hematoma could also have this appearance. No
evidence of testicular mass or torsion.

## 2018-11-07 ENCOUNTER — Encounter (HOSPITAL_COMMUNITY): Payer: Self-pay | Admitting: Emergency Medicine

## 2018-11-07 ENCOUNTER — Other Ambulatory Visit: Payer: Self-pay

## 2018-11-07 ENCOUNTER — Emergency Department (HOSPITAL_COMMUNITY)
Admission: EM | Admit: 2018-11-07 | Discharge: 2018-11-07 | Disposition: A | Discharge status: Home / Self Care | Payer: Managed Care, Other (non HMO) | Attending: Emergency Medicine | Admitting: Emergency Medicine

## 2018-11-07 ENCOUNTER — Emergency Department (HOSPITAL_COMMUNITY): Payer: Managed Care, Other (non HMO)

## 2018-11-07 DIAGNOSIS — L0291 Cutaneous abscess, unspecified: Secondary | ICD-10-CM

## 2018-11-07 DIAGNOSIS — N492 Inflammatory disorders of scrotum: Secondary | ICD-10-CM | POA: Insufficient documentation

## 2018-11-07 DIAGNOSIS — F17210 Nicotine dependence, cigarettes, uncomplicated: Secondary | ICD-10-CM | POA: Diagnosis not present

## 2018-11-07 DIAGNOSIS — J45909 Unspecified asthma, uncomplicated: Secondary | ICD-10-CM | POA: Diagnosis not present

## 2018-11-07 DIAGNOSIS — N5089 Other specified disorders of the male genital organs: Secondary | ICD-10-CM | POA: Diagnosis present

## 2018-11-07 MED ORDER — MORPHINE SULFATE (PF) 4 MG/ML IV SOLN
4.0000 mg | Freq: Once | INTRAVENOUS | Status: AC
Start: 2018-11-07 — End: 2018-11-07
  Administered 2018-11-07: 4 mg via INTRAVENOUS
  Filled 2018-11-07: qty 1

## 2018-11-07 MED ORDER — LIDOCAINE HCL 1 % IJ SOLN
INTRAMUSCULAR | Status: AC
  Administered 2018-11-07: 10 mL
  Filled 2018-11-07: qty 20

## 2018-11-07 MED ORDER — KETOROLAC TROMETHAMINE 60 MG/2ML IM SOLN
60.0000 mg | Freq: Once | INTRAMUSCULAR | Status: AC
  Administered 2018-11-07: 60 mg via INTRAMUSCULAR
  Filled 2018-11-07: qty 2

## 2018-11-07 MED ORDER — DOXYCYCLINE HYCLATE 100 MG PO TABS: 100.0000 mg | ORAL_TABLET | Freq: Two times a day (BID) | ORAL | 0 refills | Status: DC

## 2018-11-07 MED ORDER — TRAMADOL HCL 50 MG PO TABS: 50.0000 mg | ORAL_TABLET | Freq: Four times a day (QID) | ORAL | 0 refills | Status: DC | PRN

## 2018-11-07 MED ORDER — LIDOCAINE HCL 1 % IJ SOLN
10.0000 mL | Freq: Once | INTRAMUSCULAR | Status: AC
  Administered 2018-11-07: 10 mL

## 2018-11-07 MED ORDER — SODIUM CHLORIDE 0.9 % IV BOLUS
1000.0000 mL | Freq: Once | INTRAVENOUS | Status: AC
Start: 2018-11-07 — End: 2018-11-07
  Administered 2018-11-07: 1000 mL via INTRAVENOUS

## 2018-11-07 NOTE — Consult Note (Signed)
Urology Consult  Consulting MD: Casimer LaniusE. Schlossman, MD  CC: Scrotal abscess  HPI: This is a [age]year old male with history of multiple abscesses, specifically scrotum and perianal region, presenting with a 2-day history of swollen, painful left hemiscrotum.  No fever, no chills.  The patient had a left scrotal abscess drained by [place]rolog[place] in [place] in February of this year.  Evaluation has included a scrotal ultrasound which revealed a 3 cm left superficial scrotal abscess.  Urologic consultation is requested.  PMH: Past Medical History:  Diagnosis Date  . Allergy   . Asthma   . Depression     PSH: Past Surgical History:  Procedure Laterality Date  . OTHER SURGICAL HISTORY     two years ago, i had abcess lanced    Allergies: No Known Allergies  Medications: (Not in a hospital admission)    Social History: Social History   Socioeconomic History  . Marital status: Single    Spouse name: Not on file  . Number of children: Not on file  . Years of education: Not on file  . Highest education level: Not on file  Occupational History  . Not on file  Social Needs  . Financial resource strain: Not on file  . Food insecurity:    Worry: Not on file    Inability: Not on file  . Transportation needs:    Medical: Not on file    Non-medical: Not on file  Tobacco Use  . Smoking status: Light Tobacco Smoker    Packs/day: 0.25    Types: Cigarettes  . Smokeless tobacco: Never Used  Substance and Sexual Activity  . Alcohol use: Yes    Comment: 1 beer per day  . Drug use: No  . Sexual activity: Yes    Birth control/protection: Condom  Lifestyle  . Physical activity:    Days per week: Not on file    Minutes per session: Not on file  . Stress: Not on file  Relationships  . Social connections:    Talks on phone: Not on file    Gets together: Not on file    Attends religious service: Not on file    Active member of club or organization: Not on file    Attends  meetings of clubs or organizations: Not on file    Relationship status: Not on file  . Intimate partner violence:    Fear of current or ex partner: Not on file    Emotionally abused: Not on file    Physically abused: Not on file    Forced sexual activity: Not on file  Other Topics Concern  . Not on file  Social History Narrative  . Not on file    Family History: Family History  Problem Relation Age of Onset  . Hypertension Mother   . Depression Mother   . Hypertension Maternal Grandmother     Review of Systems: Positive: Left scrotal swelling, pain Negative:   A further 10 point review of systems was negative except what is listed in the HPI.  Physical Exam: @VITALS2 @ General: No acute distress lying supine.  Awake. Head:  Normocephalic.  Atraumatic. ENT:  EOMI.  Mucous membranes moist Neck:  Supple.  No lymphadenopathy. CV:  Regular rate. Pulmonary: Equal effort bilaterally.   Abdomen: Normal Skin:  Normal turgor.  No visible rash. Extremity: No gross deformity of upper extremities.  No gross deformity of lower extremities. Neurologic: Alert. Appropriate mood.  Penis:  Circumcised.  No lesions. Urethra:  Orthotopic meatus. Scrotum: Right hemiscrotum normal.  Left with fluctuant raised area anterolaterally in the superior scrotum.  Significantly tender.  No associated crepitus. Testicles: Descended bilaterally.  No masses bilaterally. Epididymis: Palpable bilaterally.  Non Tender to palpation.  Studies:  No results for input(s): HGB, WBC, PLT in the last 72 hours.  No results for input(s): NA, K, CL, CO2, BUN, CREATININE, CALCIUM, GFRNONAA, GFRAA in the last 72 hours.  Invalid input(s): MAGNESIUM   No results for input(s): INR, APTT in the last 72 hours.  Invalid input(s): PT   Invalid input(s): ABG  Scrotal ultrasound images were personally reviewed.  After verbal consent from the patient, incision and drainage was carried out.  Left hemiscrotum was prepped  with Betadine.  8 cc of 1% plain lidocaine was infiltrated over top of the abscess.  This was then incised with an 11 blade.  Approximately 30 cc of purulent matter was expressed.  I then placed quarter inch iodoform within the abscess cavity.  This was then trimmed.  Dry sterile dressing was placed after the patient was cleansed.  The patient tolerated the procedure well.  Culture was sent from the purulent material.  Assessment: Left scrotal abscess  Plan: 1.  The abscess was appropriately drained today, purulent matter cultured  2.  He will be sent out on appropriate antibiotic and pain medicine.  3.  He will leave the packing in for several days and removed, local wound care taught after removal  4.  He will call our office for follow-up if necessary    Pager:[phone]

## 2018-11-07 NOTE — ED Triage Notes (Addendum)
Pt c/o abscess at L scrotum x 2 days. Pt states it is approximately quarter size and not draining at present. Pt c/o pain to site.

## 2018-11-07 NOTE — Discharge Instructions (Signed)
1.  Leave packing material in for 3 to 4 days, then removed.  Following removal, keep the wound edges open with peroxide soaked Q-tip until the abscess closes up  2.  Complete entire course of antibiotic  3.  Call [facility] for follow-up if needed.

## 2018-11-07 NOTE — ED Provider Notes (Signed)
[facility]-EMERGENCY DEPT Provider Note   CSN: [id number] Arrival date & time: 11/07/18  4098     History   Chief Complaint Chief Complaint  Patient presents with  . Abscess    HPI Seth Spence is a 27 y.o. male presenting today for left scrotal swelling that began 2 days ago.  Patient reports history of left scrotal wall abscess in February of this year, previously drained by urology.  Patient states that 2 days ago he noticed a mild throbbing pain to the area that has gradually worsened along with the swelling.  Describes his pain as a severe constant throbbing pain to the skin of the left scrotum at this time worsened with walking or palpation.  Patient has not taken anything for his pain.  Patient denies fever, abdominal pain, testicular pain, dysuria/hematuria or penile discharge.  Patient denies bleeding or rectal pain.  HPI  Past Medical History:  Diagnosis Date  . Allergy   . Asthma   . Depression     Patient Active Problem List   Diagnosis Date Noted  . Elevated BP 01/23/2016  . Screening cholesterol level 01/23/2016  . Screening for diabetes mellitus 01/23/2016  . Vitamin D deficiency 01/23/2016  . Medication management 01/23/2016  . Perirectal abscess 04/21/2013    Past Surgical History:  Procedure Laterality Date  . OTHER SURGICAL HISTORY     two years ago, i had abcess lanced        Home Medications    Prior to Admission medications   Medication Sig Start Date End Date Taking? Authorizing Provider  clindamycin (CLEOCIN) 150 MG capsule Take 3 capsules (450 mg total) by mouth 3 (three) times daily. 12/12/17   Muthersbaugh, Dahlia Client, PA-C  ibuprofen (ADVIL,MOTRIN) 800 MG tablet Take 800 mg by mouth every 8 (eight) hours as needed for moderate pain.    [provider]  oxyCODONE-acetaminophen (PERCOCET/ROXICET) 5-325 MG tablet Take 1-2 tablets by mouth every 6 (six) hours as needed for severe pain. 12/12/17   Muthersbaugh,  Dahlia Client, PA-C    Family History Family History  Problem Relation Age of Onset  . Hypertension Mother   . Depression Mother   . Hypertension Maternal Grandmother     Social History Social History   Tobacco Use  . Smoking status: Light Tobacco Smoker    Packs/day: 0.25    Types: Cigarettes  . Smokeless tobacco: Never Used  Substance Use Topics  . Alcohol use: Yes    Comment: 1 beer per day  . Drug use: No     Allergies   Patient has no known allergies.   Review of Systems Review of Systems  Constitutional: Negative.  Negative for chills and fever.  Respiratory: Negative.  Negative for shortness of breath.   Cardiovascular: Negative.  Negative for chest pain.  Gastrointestinal: Negative.  Negative for abdominal pain, diarrhea, nausea and vomiting.  Genitourinary: Positive for scrotal swelling. Negative for difficulty urinating, discharge, dysuria, flank pain, hematuria, penile pain and testicular pain.  Musculoskeletal: Negative.  Negative for arthralgias and myalgias.  Skin: Positive for color change. Negative for wound.  Neurological: Negative.  Negative for weakness and numbness.   Physical Exam Updated Vital Signs BP 115/78 (BP Location: Left Arm)   Pulse 93   Temp 98.6 F (37 C) (Oral)   Resp 16   SpO2 98%   Physical Exam Constitutional:      General: He is not in acute distress.    Appearance: Normal appearance. He is  well-developed. He is not ill-appearing.  HENT:     Head: Normocephalic and atraumatic.     Right Ear: External ear normal.     Left Ear: External ear normal.     Nose: Nose normal.     Mouth/Throat:     Mouth: Mucous membranes are moist.     Pharynx: Oropharynx is clear.  Eyes:     Extraocular Movements: Extraocular movements intact.     Conjunctiva/sclera: Conjunctivae normal.     Pupils: Pupils are equal, round, and reactive to light.  Neck:     Musculoskeletal: Normal range of motion and neck supple.     Trachea: Trachea normal.  No tracheal deviation.  Cardiovascular:     Rate and Rhythm: Normal rate and regular rhythm.     Pulses: Normal pulses.          Dorsalis pedis pulses are 2+ on the right side and 2+ on the left side.       Posterior tibial pulses are 2+ on the right side and 2+ on the left side.     Heart sounds: Normal heart sounds.  Pulmonary:     Effort: Pulmonary effort is normal. No respiratory distress.     Breath sounds: Normal breath sounds.  Abdominal:     General: Abdomen is flat.     Palpations: Abdomen is soft.     Tenderness: There is no abdominal tenderness. There is no guarding or rebound.  Genitourinary:    Comments: Chaperone present during genital exam Deadrea NT.  No external genital lesions noted, no bumps on head of penis, specifically no vesicles concerning for herpes or chancre suggestive of syphilis.  No pain with palpation of the penis/glans, no discharge or urethritis noted.  4-5 cm left scrotal wall swelling present.  Testicles without erythema/swelling or tenderness to palpation. Cremasteric reflex intact bilaterally. No palpable hernia noted.  Musculoskeletal: Normal range of motion.     Right lower leg: No edema.     Left lower leg: No edema.  Feet:     Right foot:     Protective Sensation: 3 sites tested. 3 sites sensed.     Left foot:     Protective Sensation: 3 sites tested. 3 sites sensed.  Skin:    General: Skin is warm and dry.     Capillary Refill: Capillary refill takes less than 2 seconds.  Neurological:     Mental Status: He is alert.     GCS: GCS eye subscore is 4. GCS verbal subscore is 5. GCS motor subscore is 6.     Comments: Speech is clear and goal oriented, follows commands Major Cranial nerves without deficit, no facial droop Normal strength in upper and lower extremities bilaterally including dorsiflexion and plantar flexion, strong and equal grip strength Sensation normal to light touch Moves extremities without ataxia, coordination  intact Normal gait  Psychiatric:        Mood and Affect: Mood normal.        Behavior: Behavior normal.    ED Treatments / Results  Labs (all labs ordered are listed, but only abnormal results are displayed) Labs Reviewed  CBC WITH DIFFERENTIAL/PLATELET  BASIC METABOLIC PANEL    EKG None  Radiology US Scrotum  Result Date: 11/07/2018 CLINICAL DATA:  27 year old with a personal history of LEFT scrotal abscess in February, 2019, presenting with two-day history of LEFT scrotal pain. EXAM: SCROTAL ULTRASOUND TECHNIQUE: Complete ultrasound examination of the testicles, epididymis, and other scrotal structures was ordered,  but a complete evaluation was not performed as the patient wished to discontinue the examination due to his current pain level. Please see below. COMPARISON:  12/12/2017. FINDINGS: Right testicle Not evaluated, as the patient wished to discontinue the examination due to his current pain level. Left testicle Not evaluated, as the patient wished to discontinue the examination due to his current pain level. Right epididymis: Not evaluated, as the patient wished to discontinue the examination due to his current pain level. Left epididymis: Not evaluated, as the patient wished to discontinue the examination due to his current pain level. Pulsed Doppler interrogation of both testes was not performed as the patient wished to discontinue the examination due to his current pain level. Other: Complex, heterogeneous fluid collection involving the LEFT hemiscrotum, measuring approximately 3.4 x 3.1 x 4.0 cm, demonstrating peripheral hyperemia on color Doppler evaluation. IMPRESSION: 1. Approximate 4 cm abscess involving the LEFT hemiscrotum. 2. The remainder of the examination was not performed as the patient wished to discontinue imaging due to his current pain level. Electronically Signed   By: Hulan Saashomas  Lawrence M.D.   On: 11/07/2018 12:30   Procedures Procedures (including critical care  time)  Medications Ordered in ED Medications  sodium chloride 0.9 % bolus 1,000 mL (has no administration in time range)  morphine 4 MG/ML injection 4 mg (has no administration in time range)  ketorolac (TORADOL) injection 60 mg (has no administration in time range)   Initial Impression / Assessment and Plan / ED Course  I have reviewed the triage vital signs and the nursing notes.  Pertinent labs & imaging results that were available during my care of the patient were reviewed by me and considered in my medical decision making (see chart for details).  Clinical Course as of Nov 07 1232  Sat Nov 07, 2018  1114 GU exam chaperoned by Jeralyn Ruthseadrea NT.   [BM]    Clinical Course User Index [BM] Bill SalinasMorelli, Harold Moncus A, PA-C   27 year old male arrives with left scrotal wall abscess.  Discussed with Dr. Dalene SeltzerSchlossman, ultrasound of the scrotum ordered, discussed case with urology, Dr. Retta Dionesahlstedt who is coming to ED to evaluate patient. ------------------------------------- Ultrasound shows abscess to the left scrotum. --------------------------- Patient seen and evaluated by Dr. Retta Dionesahlstedt with urology.  Dr. Retta Dionesahlstedt performed incision/drainage and packing here in emergency department and started the patient on antibiotics, doxycycline.  Disposition and after visit summary was completed by Dr. Retta Dionesahlstedt who has discharged the patient on doxycycline with urology follow-up.  Please see Dr. Lenoria Chimeahlstedt's note for further information.  At discharge patient is afebrile, not tachycardic, not hypotensive, well-appearing and in no acute distress.  Patient does not meet SIRS or sepsis criteria.  At this time there does not appear to be any evidence of an acute emergency medical condition and the patient appears stable for discharge with appropriate outpatient follow up. Diagnosis was discussed with patient who verbalizes understanding of care plan and is agreeable to discharge. I have discussed return precautions with  patient who verbalizes understanding of return precautions. Patient strongly encouraged to follow-up with their PCP this week. All questions answered.  Patient's case discussed with Dr. Dalene SeltzerSchlossman who agrees with plan to discharge with follow-up.   Note: Portions of this report may have been transcribed using voice recognition software. Every effort was made to ensure accuracy; however, inadvertent computerized transcription errors may still be present. Final Clinical Impressions(s) / ED Diagnoses   Final diagnoses:  Abscess    ED Discharge Orders  None       Elizabeth PalauMorelli, Rushi Chasen A, PA-C 11/07/18 1343    Alvira MondaySchlossman, Erin, MD 11/09/18 0730

## 2018-11-09 LAB — AEROBIC CULTURE  (SUPERFICIAL SPECIMEN)
CULTURE: NO GROWTH
GRAM STAIN: NONE SEEN

## 2018-11-09 LAB — AEROBIC CULTURE W GRAM STAIN (SUPERFICIAL SPECIMEN): Special Requests: NORMAL

## 2018-11-29 ENCOUNTER — Encounter (HOSPITAL_COMMUNITY): Payer: Self-pay

## 2018-11-29 ENCOUNTER — Other Ambulatory Visit: Payer: Self-pay

## 2018-11-29 ENCOUNTER — Emergency Department (HOSPITAL_COMMUNITY)
Admission: EM | Admit: 2018-11-29 | Discharge: 2018-11-29 | Disposition: A | Discharge status: Home / Self Care | Payer: Managed Care, Other (non HMO) | Attending: Emergency Medicine | Admitting: Emergency Medicine

## 2018-11-29 DIAGNOSIS — Z87891 Personal history of nicotine dependence: Secondary | ICD-10-CM | POA: Insufficient documentation

## 2018-11-29 DIAGNOSIS — J45909 Unspecified asthma, uncomplicated: Secondary | ICD-10-CM | POA: Diagnosis not present

## 2018-11-29 DIAGNOSIS — L0231 Cutaneous abscess of buttock: Secondary | ICD-10-CM | POA: Diagnosis not present

## 2018-11-29 DIAGNOSIS — L0291 Cutaneous abscess, unspecified: Secondary | ICD-10-CM

## 2018-11-29 MED ORDER — LIDOCAINE-EPINEPHRINE (PF) 2 %-1:200000 IJ SOLN
10.0000 mL | Freq: Once | INTRAMUSCULAR | Status: AC
  Administered 2018-11-29: 10 mL
  Filled 2018-11-29: qty 20

## 2018-11-29 MED ORDER — HYDROMORPHONE HCL 2 MG/ML IJ SOLN
2.0000 mg | Freq: Once | INTRAMUSCULAR | Status: AC
  Administered 2018-11-29: 2 mg via INTRAMUSCULAR
  Filled 2018-11-29: qty 1

## 2018-11-29 MED ORDER — ONDANSETRON 8 MG PO TBDP
8.0000 mg | ORAL_TABLET | Freq: Once | ORAL | Status: AC
  Administered 2018-11-29: 8 mg via ORAL
  Filled 2018-11-29: qty 1

## 2018-11-29 MED ORDER — HYDROCODONE-ACETAMINOPHEN 5-325 MG PO TABS: 1.0000 | ORAL_TABLET | ORAL | 0 refills | Status: DC | PRN

## 2018-11-29 MED ORDER — AMOXICILLIN-POT CLAVULANATE 875-125 MG PO TABS: 1.0000 | ORAL_TABLET | Freq: Two times a day (BID) | ORAL | 0 refills | Status: DC

## 2018-11-29 NOTE — ED Notes (Signed)
ED Provider at bedside. 

## 2018-11-29 NOTE — ED Triage Notes (Signed)
He states he has a painful abscess at inner left buttock x 3 days. He states he has had one there once before, which had to be "drained". He is in no distress.

## 2018-11-29 NOTE — ED Provider Notes (Signed)
[facility]-EMERGENCY DEPT Provider Note   CSN: [id number] Arrival date & time: 11/29/18  0807     History   Chief Complaint Chief Complaint  Patient presents with  . Abscess    HPI Seth Spence is a 28 y.o. male.  HPI   He presents for evaluation of a sore on his left buttocks present for 3 days, worsening.  He feels like he has "another abscess."  He has had this at the same place, previously.  He denies fever, chills, nausea, vomiting, weakness or dizziness.  He had a scrotal abscess, treated with I&D and antibiotics, 2 weeks ago.  He states that has resolved.  There are no other known modifying factors.  Past Medical History:  Diagnosis Date  . Allergy   . Asthma   . Depression     Patient Active Problem List   Diagnosis Date Noted  . Elevated BP 01/23/2016  . Screening cholesterol level 01/23/2016  . Screening for diabetes mellitus 01/23/2016  . Vitamin D deficiency 01/23/2016  . Medication management 01/23/2016  . Perirectal abscess 04/21/2013    Past Surgical History:  Procedure Laterality Date  . OTHER SURGICAL HISTORY     two years ago, i had abcess lanced        Home Medications    Prior to Admission medications   Medication Sig Start Date End Date Taking? Authorizing Provider  amoxicillin-clavulanate (AUGMENTIN) 875-125 MG tablet Take 1 tablet by mouth 2 (two) times daily. One po bid x 7 days 11/29/18   Mancel Bale, MD  HYDROcodone-acetaminophen (NORCO/VICODIN) 5-325 MG tablet Take 1 tablet by mouth every 4 (four) hours as needed for moderate pain. 11/29/18   Mancel Bale, MD    Family History Family History  Problem Relation Age of Onset  . Hypertension Mother   . Depression Mother   . Hypertension Maternal Grandmother     Social History Social History   Tobacco Use  . Smoking status: Light Tobacco Smoker    Packs/day: 0.25    Types: Cigarettes  . Smokeless tobacco: Never Used  Substance Use Topics  .  Alcohol use: Yes    Comment: 1 beer per day  . Drug use: No     Allergies   Patient has no known allergies.   Review of Systems Review of Systems  All other systems reviewed and are negative.    Physical Exam Updated Vital Signs BP 108/76 (BP Location: Left Arm)   Pulse 89   Temp 98.4 F (36.9 C) (Oral)   Resp 16   Ht 5\' 10"  (1.778 m)   Wt 63.5 kg   SpO2 98%   BMI 20.09 kg/m   Physical Exam Vitals signs and nursing note reviewed.  Constitutional:      General: He is in acute distress (Uncomfortable).     Appearance: He is well-developed. He is not ill-appearing, toxic-appearing or diaphoretic.  HENT:     Head: Normocephalic and atraumatic.     Right Ear: External ear normal.     Left Ear: External ear normal.  Eyes:     Conjunctiva/sclera: Conjunctivae normal.     Pupils: Pupils are equal, round, and reactive to light.  Neck:     Musculoskeletal: Normal range of motion and neck supple.     Trachea: Phonation normal.  Cardiovascular:     Rate and Rhythm: Normal rate.  Abdominal:     Tenderness: There is abdominal tenderness.  Musculoskeletal: Normal range of motion.  Skin:  General: Skin is warm and dry.     Comments: Left lower medial buttocks with fluctuant area about 5 x 5 cm, with small amount of central drainage from an open wound.  This area is exquisitely tender.  Neurological:     Mental Status: He is alert and oriented to person, place, and time.     Cranial Nerves: No cranial nerve deficit.     Sensory: No sensory deficit.     Motor: No abnormal muscle tone.     Coordination: Coordination normal.  Psychiatric:        Behavior: Behavior normal.        Thought Content: Thought content normal.        Judgment: Judgment normal.      ED Treatments / Results  Labs (all labs ordered are listed, but only abnormal results are displayed) Labs Reviewed - No data to display  EKG None  Radiology No results found.  Procedures ..Incision and  Drainage Date/Time: 11/29/2018 10:3Marland Kitchen9 AM Performed by: Mancel BaleWentz, Dreyah Montrose, MD Authorized by: Mancel BaleWentz, Astra Gregg, MD   Consent:    Consent obtained:  Verbal   Consent given by:  Patient   Risks discussed:  Incomplete drainage and pain   Alternatives discussed:  No treatment Location:    Type:  Abscess   Size:  6 cm   Location: Left buttock. Pre-procedure details:    Skin preparation:  Betadine Sedation:    Sedation type:  Anxiolysis Anesthesia (see MAR for exact dosages):    Anesthesia method:  Local infiltration   Local anesthetic:  Lidocaine 2% WITH epi Procedure type:    Complexity:  Complex Procedure details:    Needle aspiration: yes     Needle size:  25 G   Incision types:  Single straight   Incision depth:  Subcutaneous   Scalpel blade:  11   Wound management:  Probed and deloculated, irrigated with saline and extensive cleaning   Drainage:  Bloody and serous   Drainage amount:  Moderate   Wound treatment:  Drain placed   Packing materials:  1/4 in gauze (2 inches)   Amount 1/4":  2 inches Post-procedure details:    Patient tolerance of procedure:  Tolerated well, no immediate complications   (including critical care time)  Medications Ordered in ED Medications  HYDROmorphone (DILAUDID) injection 2 mg (2 mg Intramuscular Given 11/29/18 0912)  ondansetron (ZOFRAN-ODT) disintegrating tablet 8 mg (8 mg Oral Given 11/29/18 0911)  lidocaine-EPINEPHrine (XYLOCAINE W/EPI) 2 %-1:200000 (PF) injection 10 mL (10 mLs Infiltration Given by Other 11/29/18 [id number])     Initial Impression / Assessment and Plan / ED Course  I have reviewed the triage vital signs and the nursing notes.  Pertinent labs & imaging results that were available during my care of the patient were reviewed by me and considered in my medical decision making (see chart for details).      Patient Vitals for the past 24 hrs:  BP Temp Temp src Pulse Resp SpO2 Height Weight  11/29/18 1100 108/76 - - 89 16 98 % - -    11/29/18 0813 106/66 98.4 F (36.9 C) Oral 96 16 98 % 5\' 10"  (1.778 m) 63.5 kg    At discharge- reevaluation with update and discussion. After initial assessment and treatment, an updated evaluation reveals he is more comfortable, findings discussed with the patient and all questions were answered. Mancel BaleElliott Kyal Arts   Medical Decision Making: Current abscess, left buttock, moderately large, greater than 5 cm, requiring packing.  Doubt systemic infection or metabolic instability.  Patient has recurrent infections.  Etiology is not clear.  CRITICAL CARE-no Performed by: Mancel Bale  Nursing Notes Reviewed/ Care Coordinated Applicable Imaging Reviewed Interpretation of Laboratory Data incorporated into ED treatment  The patient appears reasonably screened and/or stabilized for discharge and I doubt any other medical condition or other [facility] requiring further screening, evaluation, or treatment in the ED at this time prior to discharge.  Plan: Home Medications-OTC analgesia; Home Treatments-rest, fluids, warm soaks, patient to remove packing in 2 or 3 days; return here if the recommended treatment, does not improve the symptoms; Recommended follow up-PCP, PRN.    Final Clinical Impressions(s) / ED Diagnoses   Final diagnoses:  Abscess    ED Discharge Orders         Ordered    HYDROcodone-acetaminophen (NORCO/VICODIN) 5-325 MG tablet  Every 4 hours PRN     11/29/18 1049    amoxicillin-clavulanate (AUGMENTIN) 875-125 MG tablet  2 times daily     11/29/18 1049           Mancel Bale, MD 11/29/18 1528

## 2018-11-29 NOTE — Discharge Instructions (Addendum)
Soak in a warm tub 2 or 3 times a day for 30 minutes.  Keep the area clean with soap and water.  We put about 2 inches of packing into the wound, pull it out in a few days if it has not already fallen out.  Return here, if needed, for problems.

## 2019-05-11 IMAGING — US US SCROTUM
1 series · 13 of 24 positions shown · non-contrast
Comparison: 12/12/2017.

CLINICAL DATA: 27-year-old with a personal history of LEFT scrotal
abscess in December 2017, presenting with two-day history of LEFT
scrotal pain.

EXAM:
SCROTAL ULTRASOUND
TECHNIQUE: Complete ultrasound examination of the testicles, epididymis, and
other scrotal structures was ordered, but a complete evaluation was
not performed as the patient wished to discontinue the examination
due to his current pain level. Please see below.

[Series 1: us scrotum · 24 acquisitions, 13 frames shown]
[im 1/24]
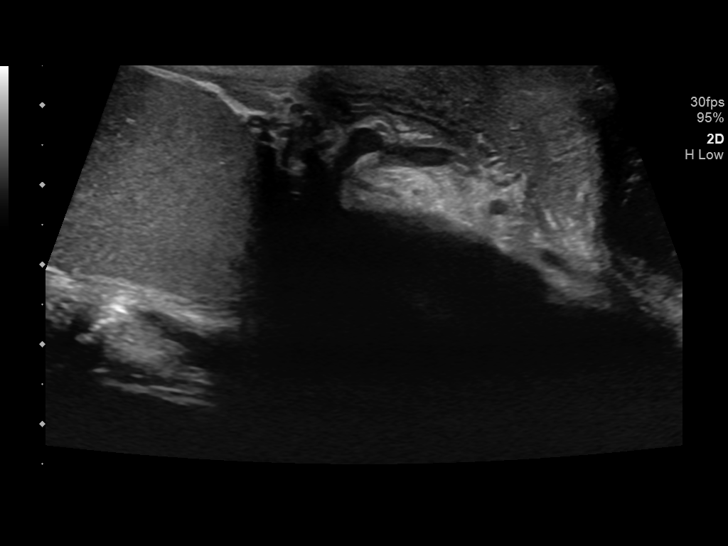
[im 3/24]
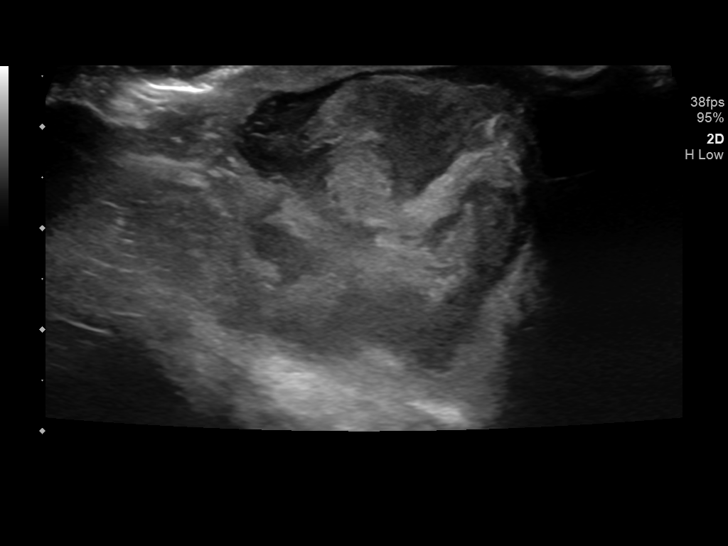
[im 5/24]
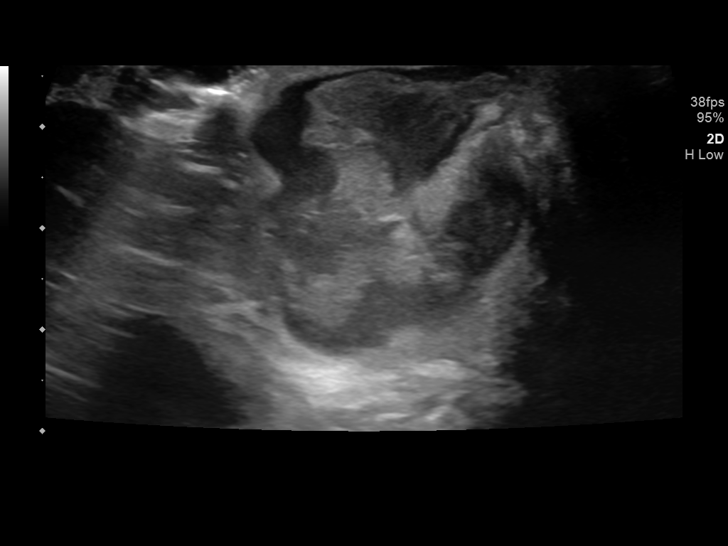
[im 7/24]
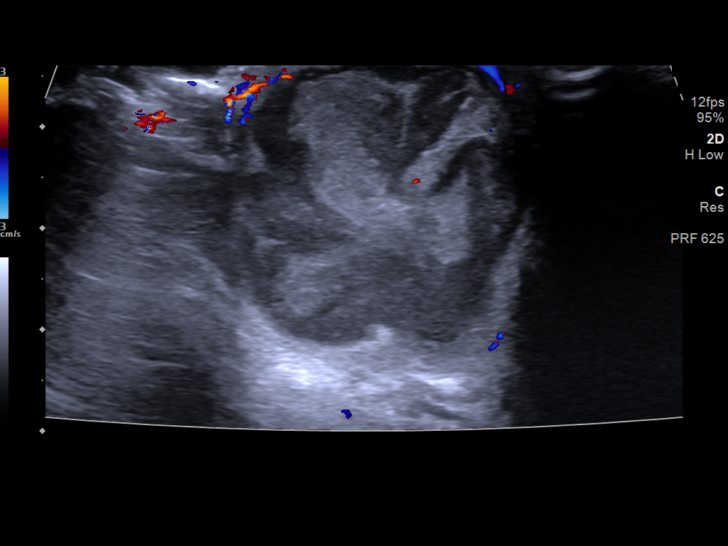
[im 9/24]
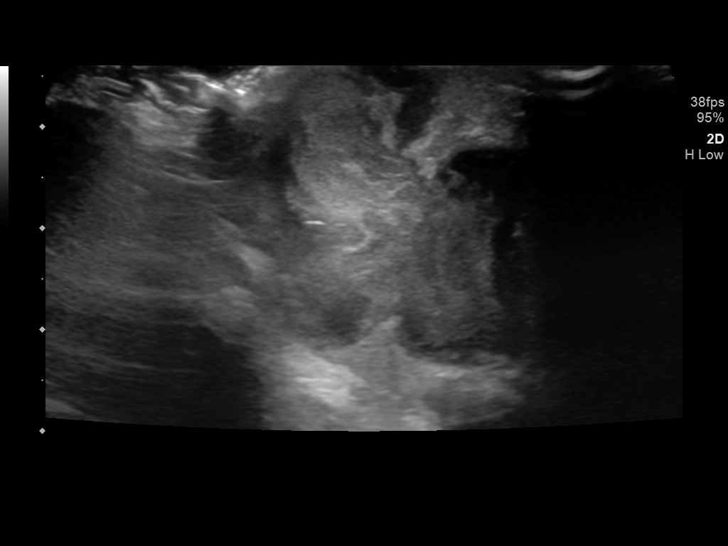
[im 11/24]
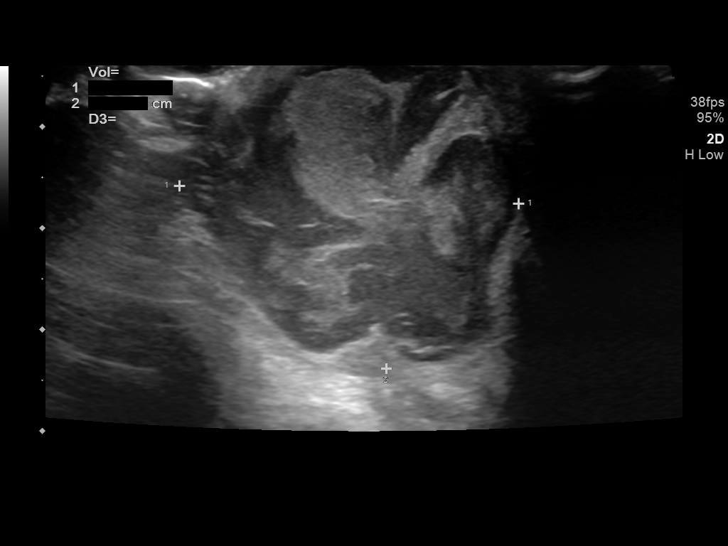
[im 13/24]
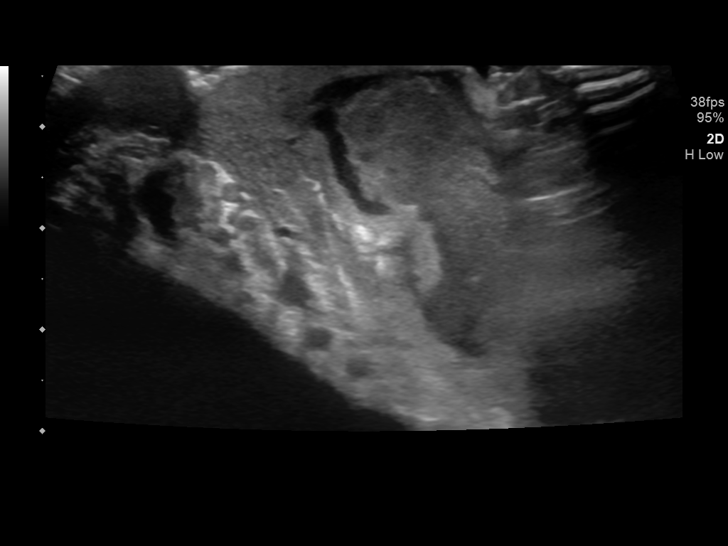
[im 14/24]
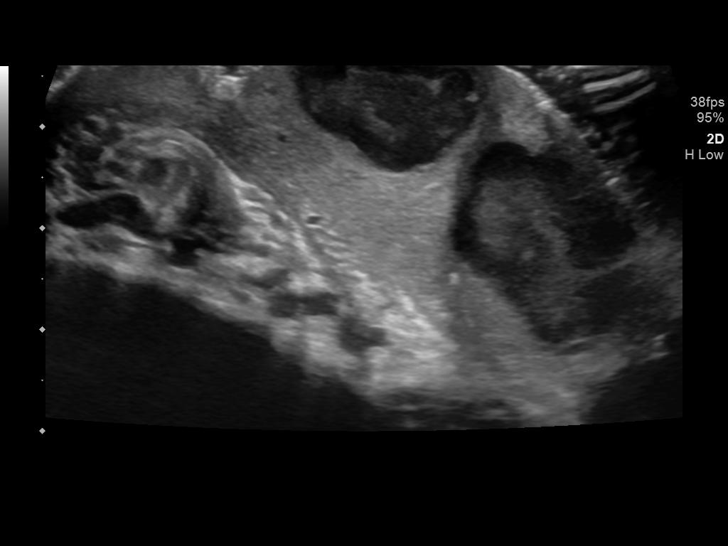
[im 16/24]
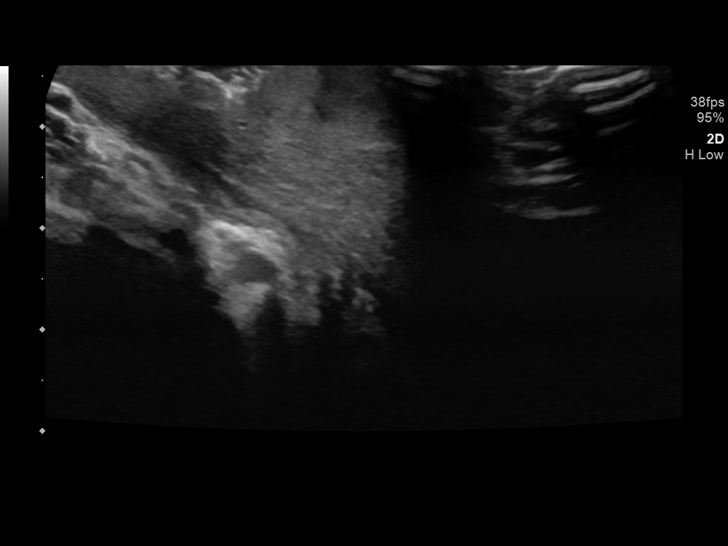
[im 18/24]
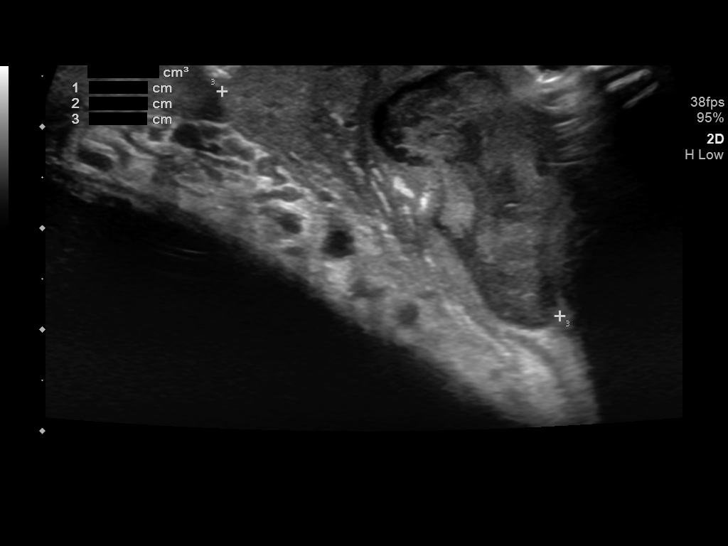
[im 20/24]
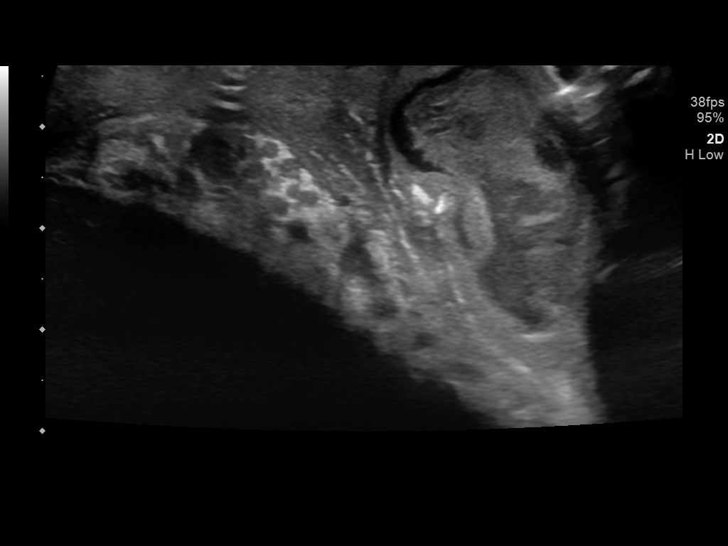
[im 22/24]
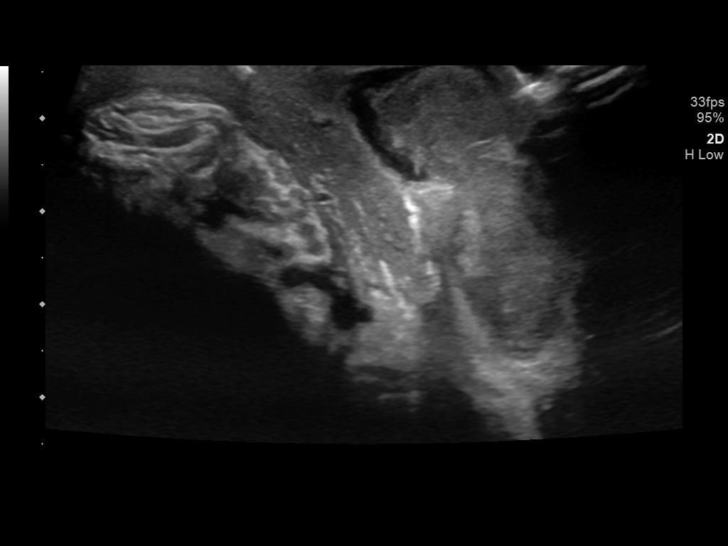
[im 24/24]
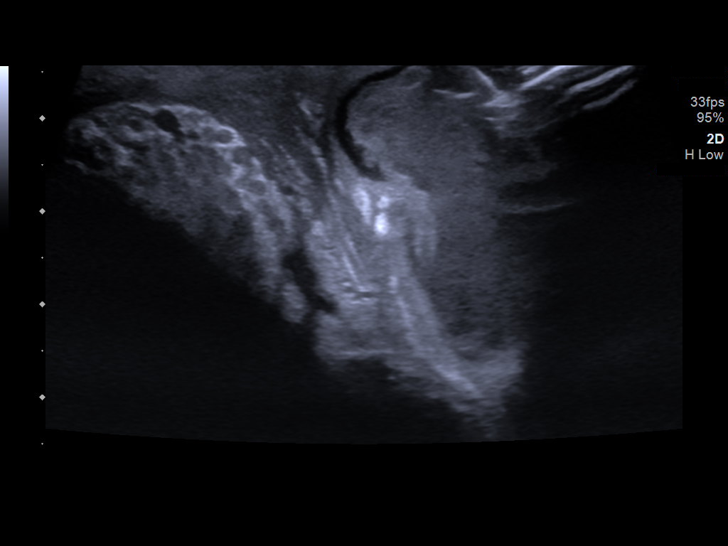

[13 of 24 positions shown; findings below may reference images not displayed]

FINDINGS: Right testicle

Not evaluated, as the patient wished to discontinue the examination
due to his current pain level.

Left testicle

Not evaluated, as the patient wished to discontinue the examination
due to his current pain level.

Right epididymis: Not evaluated, as the patient wished to
discontinue the examination due to his current pain level.

Left epididymis: Not evaluated, as the patient wished to discontinue
the examination due to his current pain level.

Pulsed Doppler interrogation of both testes was not performed as the
patient wished to discontinue the examination due to his current
pain level.

Other: Complex, heterogeneous fluid collection involving the LEFT
hemiscrotum, measuring approximately 3.4 x 3.1 x 4.0 cm,
demonstrating peripheral hyperemia on color Doppler evaluation.
IMPRESSION: 1. Approximate 4 cm abscess involving the LEFT hemiscrotum.
2. The remainder of the examination was not performed as the patient
wished to discontinue imaging due to his current pain level.

## 2019-08-22 ENCOUNTER — Emergency Department (HOSPITAL_COMMUNITY): Payer: Managed Care, Other (non HMO) | Admitting: Certified Registered"

## 2019-08-22 ENCOUNTER — Encounter (HOSPITAL_COMMUNITY): Payer: Self-pay | Admitting: *Deleted

## 2019-08-22 ENCOUNTER — Emergency Department (HOSPITAL_COMMUNITY): Payer: Managed Care, Other (non HMO)

## 2019-08-22 ENCOUNTER — Encounter (HOSPITAL_COMMUNITY)
Admission: EM | Disposition: A | Discharge status: Home / Self Care | Payer: Self-pay | Source: Home / Self Care | Attending: Emergency Medicine

## 2019-08-22 ENCOUNTER — Emergency Department (HOSPITAL_COMMUNITY)
Admission: EM | Admit: 2019-08-22 | Discharge: 2019-08-22 | Disposition: A | Discharge status: Home / Self Care | Payer: Managed Care, Other (non HMO) | Attending: Urology | Admitting: Urology

## 2019-08-22 ENCOUNTER — Other Ambulatory Visit: Payer: Self-pay

## 2019-08-22 DIAGNOSIS — F329 Major depressive disorder, single episode, unspecified: Secondary | ICD-10-CM | POA: Diagnosis not present

## 2019-08-22 DIAGNOSIS — Z79899 Other long term (current) drug therapy: Secondary | ICD-10-CM | POA: Diagnosis not present

## 2019-08-22 DIAGNOSIS — F17210 Nicotine dependence, cigarettes, uncomplicated: Secondary | ICD-10-CM | POA: Insufficient documentation

## 2019-08-22 DIAGNOSIS — D72829 Elevated white blood cell count, unspecified: Secondary | ICD-10-CM | POA: Insufficient documentation

## 2019-08-22 DIAGNOSIS — J45909 Unspecified asthma, uncomplicated: Secondary | ICD-10-CM | POA: Insufficient documentation

## 2019-08-22 DIAGNOSIS — Z20828 Contact with and (suspected) exposure to other viral communicable diseases: Secondary | ICD-10-CM | POA: Diagnosis not present

## 2019-08-22 DIAGNOSIS — N492 Inflammatory disorders of scrotum: Secondary | ICD-10-CM | POA: Diagnosis not present

## 2019-08-22 DIAGNOSIS — E876 Hypokalemia: Secondary | ICD-10-CM | POA: Insufficient documentation

## 2019-08-22 HISTORY — PX: INCISION AND DRAINAGE ABSCESS: SHX5864

## 2019-08-22 LAB — COMPREHENSIVE METABOLIC PANEL
ALT: 38 U/L (ref 0–44)
AST: 23 U/L (ref 15–41)
Albumin: 4.1 g/dL (ref 3.5–5.0)
Alkaline Phosphatase: 59 U/L (ref 38–126)
Anion gap: 9 (ref 5–15)
BUN: 13 mg/dL (ref 6–20)
CO2: 26 mmol/L (ref 22–32)
Calcium: 8.7 mg/dL — ABNORMAL LOW (ref 8.9–10.3)
Chloride: 103 mmol/L (ref 98–111)
Creatinine, Ser: 0.89 mg/dL (ref 0.61–1.24)
GFR calc Af Amer: >60 mL/min (ref >60)
GFR calc non Af Amer: >60 mL/min (ref >60)
Glucose, Bld: 106 mg/dL — ABNORMAL HIGH (ref 70–99)
Potassium: 3.3 mmol/L — ABNORMAL LOW (ref 3.5–5.1)
Sodium: 138 mmol/L (ref 135–145)
Total Bilirubin: 0.7 mg/dL (ref 0.3–1.2)
Total Protein: 7.1 g/dL (ref 6.5–8.1)

## 2019-08-22 LAB — SARS CORONAVIRUS 2 BY RT PCR (HOSPITAL ORDER, PERFORMED IN ~~LOC~~ HOSPITAL LAB): SARS Coronavirus 2: NEGATIVE

## 2019-08-22 LAB — CBC WITH DIFFERENTIAL/PLATELET
Abs Immature Granulocytes: 0.07 10*3/uL (ref 0.00–0.07)
Basophils Absolute: 0.1 10*3/uL (ref 0.0–0.1)
Basophils Relative: 0 %
Eosinophils Absolute: 0.1 10*3/uL (ref 0.0–0.5)
Eosinophils Relative: 1 %
HCT: 41.5 % (ref 39.0–52.0)
Hemoglobin: 13.1 g/dL (ref 13.0–17.0)
Immature Granulocytes: 1 %
Lymphocytes Relative: 13 %
Lymphs Abs: 2 10*3/uL (ref 0.7–4.0)
MCH: 30 pg (ref 26.0–34.0)
MCHC: 31.6 g/dL (ref 30.0–36.0)
MCV: 95 fL (ref 80.0–100.0)
Monocytes Absolute: 1.2 10*3/uL — ABNORMAL HIGH (ref 0.1–1.0)
Monocytes Relative: 8 %
Neutro Abs: 11.8 10*3/uL — ABNORMAL HIGH (ref 1.7–7.7)
Neutrophils Relative %: 77 %
Platelets: 244 10*3/uL (ref 150–400)
RBC: 4.37 MIL/uL (ref 4.22–5.81)
RDW: 12.9 % (ref 11.5–15.5)
WBC: 15.2 10*3/uL — ABNORMAL HIGH (ref 4.0–10.5)
nRBC: 0 % (ref 0.0–0.2)

## 2019-08-22 LAB — LACTIC ACID, PLASMA
Lactic Acid, Venous: 1.3 mmol/L (ref 0.5–1.9)
Lactic Acid, Venous: 0.7 mmol/L (ref 0.5–1.9)

## 2019-08-22 SURGERY — INCISION AND DRAINAGE, ABSCESS
Anesthesia: General | Site: Scrotum

## 2019-08-22 MED ORDER — LIDOCAINE 2% (20 MG/ML) 5 ML SYRINGE
INTRAMUSCULAR | Status: AC
  Filled 2019-08-22: qty 5

## 2019-08-22 MED ORDER — BUPIVACAINE-EPINEPHRINE 0.5% -1:200000 IJ SOLN
INTRAMUSCULAR | Status: DC | PRN
  Administered 2019-08-22: 10 mL

## 2019-08-22 MED ORDER — HYDROMORPHONE HCL 1 MG/ML IJ SOLN
1.0000 mg | INTRAMUSCULAR | Status: AC | PRN
  Administered 2019-08-22: 1 mg via INTRAVENOUS
  Filled 2019-08-22: qty 1

## 2019-08-22 MED ORDER — BUPIVACAINE-EPINEPHRINE 0.5% -1:200000 IJ SOLN
INTRAMUSCULAR | Status: AC
  Filled 2019-08-22: qty 1

## 2019-08-22 MED ORDER — LIDOCAINE 2% (20 MG/ML) 5 ML SYRINGE
INTRAMUSCULAR | Status: DC | PRN
  Administered 2019-08-22: 60 mg via INTRAVENOUS

## 2019-08-22 MED ORDER — 0.9 % SODIUM CHLORIDE (POUR BTL) OPTIME
TOPICAL | Status: DC | PRN
  Administered 2019-08-22: 1000 mL

## 2019-08-22 MED ORDER — HYDROCODONE-ACETAMINOPHEN 5-325 MG PO TABS: 1.0000 | ORAL_TABLET | ORAL | 0 refills | Status: DC | PRN

## 2019-08-22 MED ORDER — ONDANSETRON HCL 4 MG/2ML IJ SOLN
INTRAMUSCULAR | Status: AC
  Filled 2019-08-22: qty 2

## 2019-08-22 MED ORDER — HYDROMORPHONE HCL 1 MG/ML IJ SOLN
1.0000 mg | Freq: Once | INTRAMUSCULAR | Status: AC
  Administered 2019-08-22: 1 mg via INTRAVENOUS
  Filled 2019-08-22: qty 1

## 2019-08-22 MED ORDER — LACTATED RINGERS IV SOLN
INTRAVENOUS | Status: DC | PRN
  Administered 2019-08-22: 12:00:00 via INTRAVENOUS

## 2019-08-22 MED ORDER — BUPIVACAINE HCL (PF) 0.25 % IJ SOLN
INTRAMUSCULAR | Status: AC
  Filled 2019-08-22: qty 30

## 2019-08-22 MED ORDER — PROPOFOL 10 MG/ML IV BOLUS
INTRAVENOUS | Status: AC
  Filled 2019-08-22: qty 20

## 2019-08-22 MED ORDER — ONDANSETRON HCL 4 MG/2ML IJ SOLN
INTRAMUSCULAR | Status: DC | PRN
  Administered 2019-08-22: 4 mg via INTRAVENOUS

## 2019-08-22 MED ORDER — FENTANYL CITRATE (PF) 100 MCG/2ML IJ SOLN
INTRAMUSCULAR | Status: AC
  Filled 2019-08-22: qty 2

## 2019-08-22 MED ORDER — CLINDAMYCIN HCL 300 MG PO CAPS: 300.0000 mg | ORAL_CAPSULE | Freq: Three times a day (TID) | ORAL | 0 refills | Status: AC

## 2019-08-22 MED ORDER — HYDROMORPHONE HCL 1 MG/ML IJ SOLN: 0.2500 mg | INTRAMUSCULAR | Status: DC | PRN

## 2019-08-22 MED ORDER — MIDAZOLAM HCL 2 MG/2ML IJ SOLN
INTRAMUSCULAR | Status: AC
  Filled 2019-08-22: qty 2

## 2019-08-22 MED ORDER — FENTANYL CITRATE (PF) 100 MCG/2ML IJ SOLN
INTRAMUSCULAR | Status: DC | PRN
  Administered 2019-08-22 (×2): 50 ug via INTRAVENOUS

## 2019-08-22 MED ORDER — OXYCODONE HCL 5 MG PO TABS
5.0000 mg | ORAL_TABLET | Freq: Once | ORAL | Status: AC
  Administered 2019-08-22: 5 mg via ORAL

## 2019-08-22 MED ORDER — DEXAMETHASONE SODIUM PHOSPHATE 10 MG/ML IJ SOLN
INTRAMUSCULAR | Status: DC | PRN
  Administered 2019-08-22: 4 mg via INTRAVENOUS

## 2019-08-22 MED ORDER — OXYCODONE HCL 5 MG PO TABS
ORAL_TABLET | ORAL | Status: AC
  Filled 2019-08-22: qty 1

## 2019-08-22 MED ORDER — DEXAMETHASONE SODIUM PHOSPHATE 10 MG/ML IJ SOLN
INTRAMUSCULAR | Status: AC
  Filled 2019-08-22: qty 1

## 2019-08-22 MED ORDER — BUPIVACAINE-EPINEPHRINE (PF) 0.5% -1:200000 IJ SOLN
INTRAMUSCULAR | Status: AC
  Filled 2019-08-22: qty 1.8

## 2019-08-22 MED ORDER — MIDAZOLAM HCL 2 MG/2ML IJ SOLN
INTRAMUSCULAR | Status: DC | PRN
  Administered 2019-08-22: 2 mg via INTRAVENOUS

## 2019-08-22 MED ORDER — PROPOFOL 10 MG/ML IV BOLUS
INTRAVENOUS | Status: DC | PRN
  Administered 2019-08-22: 200 mg via INTRAVENOUS

## 2019-08-22 MED ORDER — SODIUM CHLORIDE 0.9 % IV BOLUS
1000.0000 mL | Freq: Once | INTRAVENOUS | Status: AC
  Administered 2019-08-22: 07:00:00 1000 mL via INTRAVENOUS

## 2019-08-22 MED ORDER — CLINDAMYCIN PHOSPHATE 600 MG/50ML IV SOLN
600.0000 mg | Freq: Once | INTRAVENOUS | Status: AC
  Administered 2019-08-22: 600 mg via INTRAVENOUS
  Filled 2019-08-22: qty 50

## 2019-08-22 MED ORDER — ONDANSETRON HCL 4 MG/2ML IJ SOLN
4.0000 mg | Freq: Once | INTRAMUSCULAR | Status: AC
  Administered 2019-08-22: 4 mg via INTRAVENOUS
  Filled 2019-08-22: qty 2

## 2019-08-22 SURGICAL SUPPLY — 40 items
BENZOIN TINCTURE PRP APPL 2/3 (GAUZE/BANDAGES/DRESSINGS) IMPLANT
BLADE HEX COATED 2.75 (ELECTRODE) ×4 IMPLANT
BLADE SURG 15 STRL LF DISP TIS (BLADE) ×2 IMPLANT
BLADE SURG 15 STRL SS (BLADE) ×2
BLADE SURG SZ10 CARB STEEL (BLADE) ×4 IMPLANT
COVER SURGICAL LIGHT HANDLE (MISCELLANEOUS) ×2 IMPLANT
COVER WAND RF STERILE (DRAPES) IMPLANT
DECANTER SPIKE VIAL GLASS SM (MISCELLANEOUS) IMPLANT
DRAPE LAPAROSCOPIC ABDOMINAL (DRAPES) IMPLANT
DRAPE LAPAROTOMY T 102X78X121 (DRAPES) ×2 IMPLANT
DRAPE LAPAROTOMY TRNSV 102X78 (DRAPES) IMPLANT
DRAPE POUCH INSTRU U-SHP 10X18 (DRAPES) ×2 IMPLANT
DRAPE SHEET LG 3/4 BI-LAMINATE (DRAPES) IMPLANT
ELECT PENCIL ROCKER SW 15FT (MISCELLANEOUS) ×4 IMPLANT
ELECT REM PT RETURN 15FT ADLT (MISCELLANEOUS) ×4 IMPLANT
EVACUATOR SILICONE 100CC (DRAIN) IMPLANT
GAUZE PACKING IODOFORM 1/2 (PACKING) ×4 IMPLANT
GAUZE SPONGE 4X4 12PLY STRL (GAUZE/BANDAGES/DRESSINGS) ×4 IMPLANT
GLOVE BIOGEL PI IND STRL 7.0 (GLOVE) ×1 IMPLANT
GLOVE BIOGEL PI INDICATOR 7.0 (GLOVE) ×1
GLOVE EUDERMIC 7 POWDERFREE (GLOVE) ×4 IMPLANT
GOWN STRL REUS W/TWL LRG LVL3 (GOWN DISPOSABLE) ×2 IMPLANT
GOWN STRL REUS W/TWL XL LVL3 (GOWN DISPOSABLE) ×4 IMPLANT
KIT BASIN OR (CUSTOM PROCEDURE TRAY) ×4 IMPLANT
KIT TURNOVER KIT A (KITS) IMPLANT
NEEDLE HYPO 25X1 1.5 SAFETY (NEEDLE) ×4 IMPLANT
NS IRRIG 1000ML POUR BTL (IV SOLUTION) ×4 IMPLANT
PACK BASIC VI WITH GOWN DISP (CUSTOM PROCEDURE TRAY) ×4 IMPLANT
SOL PREP POV-IOD 4OZ 10% (MISCELLANEOUS) ×2 IMPLANT
SPONGE LAP 18X18 RF (DISPOSABLE) ×2 IMPLANT
SPONGE LAP 4X18 RFD (DISPOSABLE) ×4 IMPLANT
STAPLER VISISTAT 35W (STAPLE) IMPLANT
STRIP CLOSURE SKIN 1/2X4 (GAUZE/BANDAGES/DRESSINGS) IMPLANT
SUT CHROMIC 2 0 SH (SUTURE) ×2 IMPLANT
SUT MNCRL AB 4-0 PS2 18 (SUTURE) IMPLANT
SUT VIC AB 3-0 SH 18 (SUTURE) IMPLANT
SYR CONTROL 10ML LL (SYRINGE) ×4 IMPLANT
TOWEL OR 17X26 10 PK STRL BLUE (TOWEL DISPOSABLE) ×2 IMPLANT
WATER STERILE IRR 1000ML POUR (IV SOLUTION) ×2 IMPLANT
YANKAUER SUCT BULB TIP 10FT TU (MISCELLANEOUS) IMPLANT

## 2019-08-22 NOTE — Anesthesia Postprocedure Evaluation (Signed)
Anesthesia Post Note  Patient: Seth Spence  Procedure(s) Performed: INCISION AND DRAINAGE ABSCESS scrotal (N/A Scrotum)     Patient location during evaluation: PACU Anesthesia Type: General Level of consciousness: awake and alert Pain management: pain level controlled Vital Signs Assessment: post-procedure vital signs reviewed and stable Respiratory status: spontaneous breathing, nonlabored ventilation and respiratory function stable Cardiovascular status: blood pressure returned to baseline and stable Postop Assessment: no apparent nausea or vomiting Anesthetic complications: no    Last Vitals:  Vitals:   08/22/19 1345 08/22/19 1415  BP: 125/81   Pulse:    Resp:    Temp:  36.8 C  SpO2:      Last Pain:  Vitals:   08/22/19 1415  TempSrc:   PainSc: 1                  Kiyah Demartini,W. EDMOND

## 2019-08-22 NOTE — Transfer of Care (Signed)
Immediate Anesthesia Transfer of Care Note  Patient: Seth Spence  Procedure(s) Performed: INCISION AND DRAINAGE ABSCESS scrotal (N/A Scrotum)  Patient Location: PACU  Anesthesia Type:General  Level of Consciousness: awake, alert  and oriented  Airway & Oxygen Therapy: Patient Spontanous Breathing and Patient connected to face mask oxygen  Post-op Assessment: Report given to RN, Post -op Vital signs reviewed and stable and Patient moving all extremities X 4  Post vital signs: Reviewed and stable  Last Vitals:  Vitals Value Taken Time  BP    Temp    Pulse    Resp 15 08/22/19 1326  SpO2    Vitals shown include unvalidated device data.  Last Pain:  Vitals:   08/22/19 0759  TempSrc:   PainSc: 7          Complications: No apparent anesthesia complications

## 2019-08-22 NOTE — Anesthesia Preprocedure Evaluation (Addendum)
Anesthesia Evaluation  Patient identified by MRN, date of birth, ID band Patient awake    Reviewed: Allergy & Precautions, H&P , NPO status , Patient's Chart, lab work & pertinent test results  Airway Mallampati: II  TM Distance: >3 FB Neck ROM: Full    Dental no notable dental hx. (+) Teeth Intact, Dental Advisory Given   Pulmonary asthma , Current Smoker and Patient abstained from smoking.,    Pulmonary exam normal breath sounds clear to auscultation       Cardiovascular negative cardio ROS   Rhythm:Regular Rate:Normal     Neuro/Psych Depression negative neurological ROS     GI/Hepatic negative GI ROS, Neg liver ROS,   Endo/Other  negative endocrine ROS  Renal/GU negative Renal ROS  negative genitourinary   Musculoskeletal   Abdominal   Peds  Hematology negative hematology ROS (+)   Anesthesia Other Findings   Reproductive/Obstetrics negative OB ROS                            Anesthesia Physical Anesthesia Plan  ASA: II  Anesthesia Plan: General   Post-op Pain Management:    Induction: Intravenous  PONV Risk Score and Plan: 2 and Ondansetron, Dexamethasone and Midazolam  Airway Management Planned: LMA  Additional Equipment:   Intra-op Plan:   Post-operative Plan: Extubation in OR  Informed Consent: I have reviewed the patients History and Physical, chart, labs and discussed the procedure including the risks, benefits and alternatives for the proposed anesthesia with the patient or authorized representative who has indicated his/her understanding and acceptance.     Dental advisory given  Plan Discussed with: CRNA  Anesthesia Plan Comments:         Anesthesia Quick Evaluation

## 2019-08-22 NOTE — ED Provider Notes (Signed)
[facility]-EMERGENCY DEPT Provider Note   CSN: [id number] Arrival date & time: 08/22/19  0542     History   Chief Complaint Chief Complaint  Patient presents with  . Abscess    HPI Seth Spence is a 28 y.o. male.     HPI   Pt is a 28 y/o male who presents to the ED today c/o left testicular pain that has been present for the last 3 days. He states that he has left testicular swelling, redness, and pain. He has tried soaking in a hot tub with no significant relief. He has had some drainage of pus from the wound. Has has some sweats, but no documented fevers. He has a h/o same and has had multiple I&Ds. Denies dysuria, frequency, urgency, penile discharge.  He reports he is sexually active with one partner for the last 2 years. They are monogamous and do not use protection.   Past Medical History:  Diagnosis Date  . Allergy   . Asthma   . Depression     Patient Active Problem List   Diagnosis Date Noted  . Elevated BP 01/23/2016  . Screening cholesterol level 01/23/2016  . Screening for diabetes mellitus 01/23/2016  . Vitamin D deficiency 01/23/2016  . Medication management 01/23/2016  . Perirectal abscess 04/21/2013    Past Surgical History:  Procedure Laterality Date  . OTHER SURGICAL HISTORY     two years ago, i had abcess lanced        Home Medications    Prior to Admission medications   Medication Sig Start Date End Date Taking? Authorizing Provider  amoxicillin-clavulanate (AUGMENTIN) 875-125 MG tablet Take 1 tablet by mouth 2 (two) times daily. One po bid x 7 days Patient not taking: Reported on 08/22/2019 11/29/18   Mancel Bale, MD  HYDROcodone-acetaminophen (NORCO/VICODIN) 5-325 MG tablet Take 1 tablet by mouth every 4 (four) hours as needed for moderate pain. Patient not taking: Reported on 08/22/2019 11/29/18   Mancel Bale, MD    Family History Family History  Problem Relation Age of Onset  . Hypertension Mother    . Depression Mother   . Hypertension Maternal Grandmother     Social History Social History   Tobacco Use  . Smoking status: Light Tobacco Smoker    Packs/day: 0.25    Types: Cigarettes  . Smokeless tobacco: Never Used  Substance Use Topics  . Alcohol use: Yes    Comment: 1 beer per day  . Drug use: No     Allergies   Patient has no known allergies.   Review of Systems Review of Systems  Constitutional: Negative for fever.  HENT: Negative for ear pain and sore throat.   Eyes: Negative for visual disturbance.  Respiratory: Negative for cough and shortness of breath.   Cardiovascular: Negative for chest pain.  Gastrointestinal: Positive for nausea. Negative for abdominal pain, constipation, diarrhea and vomiting.  Genitourinary: Positive for scrotal swelling and testicular pain. Negative for discharge, dysuria, frequency, hematuria and penile pain.  Musculoskeletal: Negative for back pain.  Skin: Negative for rash.  Neurological: Negative for headaches.  All other systems reviewed and are negative.    Physical Exam Updated Vital Signs BP 111/84   Pulse 79   Temp 98.1 F (36.7 C) (Oral) Comment: refuses rectal temp " states please don't "  Resp 16   Wt 63.5 kg   SpO2 98%   BMI 20.09 kg/m   Physical Exam Vitals signs and nursing note reviewed.  Constitutional:      Appearance: He is well-developed.     Comments: Appears uncomfortable  HENT:     Head: Normocephalic and atraumatic.  Eyes:     Conjunctiva/sclera: Conjunctivae normal.  Neck:     Musculoskeletal: Neck supple.  Cardiovascular:     Rate and Rhythm: Normal rate and regular rhythm.     Pulses: Normal pulses.     Heart sounds: Normal heart sounds. No murmur.  Pulmonary:     Effort: Pulmonary effort is normal. No respiratory distress.     Breath sounds: Normal breath sounds. No wheezing, rhonchi or rales.  Abdominal:     General: Bowel sounds are normal.     Palpations: Abdomen is soft.      Tenderness: There is no abdominal tenderness. There is no guarding or rebound.  Genitourinary:    Comments: Chaperone present. Normal uncircumcised penis. Left testicle is swollen, erythematous, warm, and TTP. There is an area of fluctuance to the left lower aspect of the scrotum. No TTP to the right testicle. No crepitus.  Skin:    General: Skin is warm and dry.  Neurological:     Mental Status: He is alert.      ED Treatments / Results  Labs (all labs ordered are listed, but only abnormal results are displayed) Labs Reviewed  CBC WITH DIFFERENTIAL/PLATELET - Abnormal; Notable for the following components:      Result Value   WBC 15.2 (*)    Neutro Abs 11.8 (*)    Monocytes Absolute 1.2 (*)    All other components within normal limits  COMPREHENSIVE METABOLIC PANEL - Abnormal; Notable for the following components:   Potassium 3.3 (*)    Glucose, Bld 106 (*)    Calcium 8.7 (*)    All other components within normal limits  SARS CORONAVIRUS 2 BY RT PCR (HOSPITAL ORDER, PERFORMED IN [facility])  AEROBIC/ANAEROBIC CULTURE (SURGICAL/DEEP WOUND)  LACTIC ACID, PLASMA  LACTIC ACID, PLASMA    EKG None  Radiology [place] Scrotum W/doppler  Result Date: 08/22/2019 CLINICAL DATA:  Left scrotal abscess. EXAM: SCROTAL ULTRASOUND DOPPLER ULTRASOUND OF THE TESTICLES TECHNIQUE: Complete ultrasound examination of the testicles, epididymis, and other scrotal structures was performed. Color and spectral Doppler ultrasound were also utilized to evaluate blood flow to the testicles. This examination is limited due to the patient's pain. COMPARISON:  November 07, 2018.  December 12, 2017. FINDINGS: Right testicle Measurements: 5.1 x 3.8 x 3.0 cm. No mass or microlithiasis visualized. Left testicle Measurements: 5.0 x 4.1 x 3.3 cm. No mass or microlithiasis visualized. Right epididymis:  Not visualized due to pain tolerance. Left epididymis:  Not visualized due to pain tolerance. Hydrocele:   None visualized. Varicocele:  None visualized. Pulsed Doppler interrogation of both testes demonstrates normal low resistance arterial and venous waveforms bilaterally. 2.3 x 2.1 x 1.4 cm complex fluid collection is seen in the soft tissues adjacent to the left testicle concerning for abscess. This appears to be smaller compared to prior exam. Doppler demonstrates peripheral color flow consistent with inflammation. IMPRESSION: 2.3 x 2.1 x 1.4 cm complex fluid collection is seen in the soft tissues adjacent the left testicle concerning for abscess. This appears to be slightly smaller compared to prior exam. There is no evidence of testicular mass or torsion. The epididymii cannot be evaluated due to pain tolerance. Electronically Signed   By: Lupita RaiderJames  Green Jr M.D.   On: 08/22/2019 08:05    Procedures Procedures (including critical care  time)  Medications Ordered in ED Medications  0.9 % irrigation (POUR BTL) (1,000 mLs Irrigation Given 08/22/19 1254)  HYDROmorphone (DILAUDID) injection 1 mg (1 mg Intravenous Given 08/22/19 0706)  sodium chloride 0.9 % bolus 1,000 mL (0 mLs Intravenous Stopped 08/22/19 0759)  HYDROmorphone (DILAUDID) injection 1 mg (1 mg Intravenous Given 08/22/19 0806)  ondansetron (ZOFRAN) injection 4 mg (4 mg Intravenous Given 08/22/19 0706)  clindamycin (CLEOCIN) IVPB 600 mg (0 mg Intravenous Stopped 08/22/19 0837)  fentaNYL (SUBLIMAZE) 100 MCG/2ML injection (has no administration in time range)  propofol (DIPRIVAN) 10 mg/mL bolus/IV push (has no administration in time range)  midazolam (VERSED) 2 MG/2ML injection (has no administration in time range)  lidocaine 20 MG/ML injection (has no administration in time range)  bupivacaine (PF) (MARCAINE) 0.25 % injection (has no administration in time range)  bupivacaine-epinephrine (MARCAINE W/ EPI) 0.5% -1:200000 injection (has no administration in time range)  bupivacaine-EPINEPHrine (MARCAINE W/ EPI) 0.5% -1:200000 (with pres)  injection (has no administration in time range)  dexamethasone (DECADRON) 10 MG/ML injection (has no administration in time range)  ondansetron (ZOFRAN) 4 MG/2ML injection (has no administration in time range)     Initial Impression / Assessment and Plan / ED Course  I have reviewed the triage vital signs and the nursing notes.  Pertinent labs & imaging results that were available during my care of the patient were reviewed by me and considered in my medical decision making (see chart for details).      Final Clinical Impressions(s) / ED Diagnoses   Final diagnoses:  Scrotal abscess   28 year old male presenting with concern for left scrotal/testicular abscess.  Has history of same and has had multiple I&D's.  Episode started 3 days ago.  No associated fevers  On arrival afebrile with normal vital signs.  Low concern for sepsis initially.  Will obtain labs, give IV antibiotics and obtain scrotal ultrasound.  CBC with leukocytosis at 15, no anemia CMP with mild hypokalemia, otherwise reassuring Lactic negative  Scrotal US with 2.3 x 2.1 x 1.4 cm complex fluid collection is seen in the soft tissues adjacent the left testicle concerning for abscess. This appears to be slightly smaller compared to prior exam. There is no evidence of testicular mass or torsion. The epididymii cannot be evaluated due to pain tolerance.   8:57 AM CONSULT with Dr. Graylon Good with urology who will see the patient in the ED.   9:15 AM Urology at bedside evaluating the patient. They will take pt to the OR for I&D.   ED Discharge Orders    None       Bishop Dublin 08/22/19 1301    Lacretia Leigh, MD 08/22/19 [phone]

## 2019-08-22 NOTE — Consult Note (Signed)
Urology Consult   Physician requesting consult: Cortni Couture  Reason for consult: Scrotal abscess  History of Present Illness: Seth Spence is a 28 y.o. with history of prior scrotal abscess requiring incision and drainage in the past presenting with recurrent left scrotal abscess, confirmed on scrotal ultrasound.  He has had symptoms of testicular swelling and pain with some drainage of pus from the wound at home for 3 days.  Denies fevers.  Denies dysuria, hematuria, any difficulty urinating.  No significant other past medical history; in addition to scrotal abscess has had buttocks abscess which was drained in January 2 120.  He is uncertain why he is had recurrent abscesses.  He denies any history of diabetes.  Past Medical History:  Diagnosis Date  . Allergy   . Asthma   . Depression     Past Surgical History:  Procedure Laterality Date  . OTHER SURGICAL HISTORY     two years ago, i had abcess lanced    Current Hospital Medications:  Home Meds:  No current facility-administered medications on file prior to encounter.    Current Outpatient Medications on File Prior to Encounter  Medication Sig Dispense Refill  . amoxicillin-clavulanate (AUGMENTIN) 875-125 MG tablet Take 1 tablet by mouth 2 (two) times daily. One po bid x 7 days (Patient not taking: Reported on 08/22/2019) 14 tablet 0  . HYDROcodone-acetaminophen (NORCO/VICODIN) 5-325 MG tablet Take 1 tablet by mouth every 4 (four) hours as needed for moderate pain. (Patient not taking: Reported on 08/22/2019) 15 tablet 0     Scheduled Meds: Continuous Infusions: PRN Meds:.  Allergies: No Known Allergies  Family History  Problem Relation Age of Onset  . Hypertension Mother   . Depression Mother   . Hypertension Maternal Grandmother     Social History:  reports that he has been smoking cigarettes. He has been smoking about 0.25 packs per day. He has never used smokeless tobacco. He reports current alcohol use.  He reports that he does not use drugs.  ROS: A complete review of systems was performed.  All systems are negative except for pertinent findings as noted.  Physical Exam:  Vital signs in last 24 hours: Temp:  [98.1 F (36.7 C)-99.3 F (37.4 C)] 98.1 F (36.7 C) (10/11 0658) Pulse Rate:  [67-93] 84 (10/11 0805) Resp:  [15-20] 15 (10/11 0805) BP: (117-127)/(80-86) 117/86 (10/11 0805) SpO2:  [99 %-100 %] 100 % (10/11 0805) Constitutional:  Alert and oriented Cardiovascular: Regular rate  Respiratory: Normal respiratory effort, Lungs  GI: Abdomen is soft,  nondistended GU: Normal uncircumcised phallus.  Left scrotum is swollen and tenderness to palpation.  There is an area of fluctuance on the left lateral scrotum with some scant drainage. Lymphatic: No lymphadenopathy Neurologic: Grossly intact, no focal deficits Psychiatric: Normal mood and affect  Laboratory Data:  Recent Labs    08/22/19 0651  WBC 15.2*  HGB 13.1  HCT 41.5  PLT 244    No results for input(s): NA, K, CL, GLUCOSE, BUN, CALCIUM, CREATININE in the last 72 hours.  Invalid input(s): CO3   Results for orders placed or performed during the hospital encounter of 08/22/19 (from the past 24 hour(s))  CBC with Differential     Status: Abnormal   Collection Time: 08/22/19  6:51 AM  Result Value Ref Range   WBC 15.2 (H) 4.0 - 10.5 K/uL   RBC 4.37 4.22 - 5.81 MIL/uL   Hemoglobin 13.1 13.0 - 17.0 g/dL   HCT 41.5 39.0 -  52.0 %   MCV 95.0 80.0 - 100.0 fL   MCH 30.0 26.0 - 34.0 pg   MCHC 31.6 30.0 - 36.0 g/dL   RDW 80.9 98.3 - 38.2 %   Platelets 244 150 - 400 K/uL   nRBC 0.0 0.0 - 0.2 %   Neutrophils Relative % 77 %   Neutro Abs 11.8 (H) 1.7 - 7.7 K/uL   Lymphocytes Relative 13 %   Lymphs Abs 2.0 0.7 - 4.0 K/uL   Monocytes Relative 8 %   Monocytes Absolute 1.2 (H) 0.1 - 1.0 K/uL   Eosinophils Relative 1 %   Eosinophils Absolute 0.1 0.0 - 0.5 K/uL   Basophils Relative 0 %   Basophils Absolute 0.1 0.0 - 0.1  K/uL   Immature Granulocytes 1 %   Abs Immature Granulocytes 0.07 0.00 - 0.07 K/uL   No results found for this or any previous visit (from the past 240 hour(s)).  Renal Function: No results for input(s): CREATININE in the last 168 hours. CrCl cannot be calculated (Patient's most recent lab result is older than the maximum 21 days allowed.).  Radiologic Imaging: US Scrotum W/doppler  Result Date: 08/22/2019 CLINICAL DATA:  Left scrotal abscess. EXAM: SCROTAL ULTRASOUND DOPPLER ULTRASOUND OF THE TESTICLES TECHNIQUE: Complete ultrasound examination of the testicles, epididymis, and other scrotal structures was performed. Color and spectral Doppler ultrasound were also utilized to evaluate blood flow to the testicles. This examination is limited due to the patient's pain. COMPARISON:  November 07, 2018.  December 12, 2017. FINDINGS: Right testicle Measurements: 5.1 x 3.8 x 3.0 cm. No mass or microlithiasis visualized. Left testicle Measurements: 5.0 x 4.1 x 3.3 cm. No mass or microlithiasis visualized. Right epididymis:  Not visualized due to pain tolerance. Left epididymis:  Not visualized due to pain tolerance. Hydrocele:  None visualized. Varicocele:  None visualized. Pulsed Doppler interrogation of both testes demonstrates normal low resistance arterial and venous waveforms bilaterally. 2.3 x 2.1 x 1.4 cm complex fluid collection is seen in the soft tissues adjacent to the left testicle concerning for abscess. This appears to be smaller compared to prior exam. Doppler demonstrates peripheral color flow consistent with inflammation. IMPRESSION: 2.3 x 2.1 x 1.4 cm complex fluid collection is seen in the soft tissues adjacent the left testicle concerning for abscess. This appears to be slightly smaller compared to prior exam. There is no evidence of testicular mass or torsion. The epididymii cannot be evaluated due to pain tolerance. Electronically Signed   By: Lupita Raider M.D.   On: 08/22/2019 08:05     I independently reviewed the above imaging studies.  Impression/Recommendation 28 year old male presenting with recurrent left scrotal abscess for which incision and drainage is indicated.  Given symptoms and characteristics on ultrasound, procedure is best done under anesthesia today.  This was discussed with patient and he would like to proceed.  Plan for incision and drainage of left scrotal abscess in OR today once COVID test result is available.  Please keep n.p.o.  Roxanne Gates 08/22/2019, 9:18 AM

## 2019-08-22 NOTE — ED Triage Notes (Signed)
Abscess on the left testicle that he noticed yesterday.

## 2019-08-22 NOTE — Op Note (Signed)
Preoperative diagnosis: Left scrotal abscess  Postoperative diagnosis: Left scrotal abscess  Procedure: Left scrotal incision and drainage  Surgeon: Aleen Campi MD Assistant: Kerrie Pleasure  Anesthesia: General  Complications: None  Intraoperative findings: Fluctuant pocket corresponding to abscess cavity on left lateral scrotum successfully incised and drained.  Underlying loculated fluid collection broken up easily with excellent hemostasis at end of the case.  EBL: Minimal  Specimens: 1. Wound culture   Indication: Seth Spence  is a 28 y.o. patient with recurrent left scrotal abscess. After reviewing the management options for treatment, they elected to proceed with the above surgical procedure(s). We have discussed the potential benefits and risks of the procedure, side effects of the proposed treatment, the likelihood of the patient achieving the goals of the procedure, and any potential problems that might occur during the procedure or recuperation. Informed consent has been obtained.  Description of procedure:  The patient was taken to the operating room and general anesthesia was induced.  The patient was placed in the dorsal lithotomy position, prepped and draped in the usual sterile fashion, and preoperative antibiotics were administered. A preoperative time-out was performed.   At start of case, 10 cc 0.5% lidocaine with epinephrine was injected at site of maximal fluctuance.  A 4 cm vertical incision was made at this site with rapid expulsion of purulent fluid upon incision.  Wound culture was collected and sent.  Using blunt dissection loculations within abscess cavity were broken up so that entire abscess cavity could be well drained.  Entire abscess cavity of approximately 3 cm x 10 cm x 3 cm was completely drained to our satisfaction.  Hemostasis was obtained with Bovie electrocautery.  The wound was irrigated.  Packing was placed within entire abscess cavity  until it was thoroughly packed.  Fluffs and jockstrap applied.  Patient tolerated procedure well, was extubated, and proceeded to PACU for recovery without acute complication.  POST OP PLAN:  Discharge to home when meets PACU criteria. Continue antibiotics for 7 days. Again to be changed at home every other day. We will arrange outpatient follow-up for wound check.

## 2019-08-22 NOTE — ED Notes (Signed)
Electronic consent signed by patient and witnessed by this RN

## 2019-08-22 NOTE — Progress Notes (Signed)
PACU NURSING NOTE: order rec for DC to home post operatively. Alert and oriented x 4, mae x 4, pain at acceptable level. Able to ambulate, gait very steady, tolerated PO fluids and able to void w/o incidence. AVS and DC instructions from Anesthesia team reviewed and discussed with pt, opportunity for questions provided. Escorted to main entrance via wc, girlfriend here.

## 2019-08-22 NOTE — Anesthesia Procedure Notes (Signed)
Procedure Name: LMA Insertion Date/Time: 08/22/2019 12:32 PM Performed by: Niel Hummer, CRNA Pre-anesthesia Checklist: Patient identified, Emergency Drugs available, Suction available and Patient being monitored Patient Re-evaluated:Patient Re-evaluated prior to induction Oxygen Delivery Method: Circle system utilized Preoxygenation: Pre-oxygenation with 100% oxygen Induction Type: IV induction LMA: LMA inserted LMA Size: 4.0 Dental Injury: Teeth and Oropharynx as per pre-operative assessment

## 2019-08-23 ENCOUNTER — Encounter (HOSPITAL_COMMUNITY): Payer: Self-pay | Admitting: Urology

## 2019-08-26 LAB — AEROBIC/ANAEROBIC CULTURE W GRAM STAIN (SURGICAL/DEEP WOUND)

## 2020-02-23 IMAGING — US US SCROTUM W/ DOPPLER COMPLETE
1 series · 13 of 25 positions shown · non-contrast
Comparison: [DATE] [DATE], [DATE].  [DATE] [DATE], [DATE].

CLINICAL DATA: Left scrotal abscess.

EXAM:
SCROTAL ULTRASOUND
DOPPLER ULTRASOUND OF THE TESTICLES
TECHNIQUE: Complete ultrasound examination of the testicles, epididymis, and
other scrotal structures was performed. Color and spectral Doppler
ultrasound were also utilized to evaluate blood flow to the
testicles. This examination is limited due to the patient's pain.

[Series 1: us scrotum w/ doppler complete · 43 acquisitions, 13 frames shown]
[im 1/43]
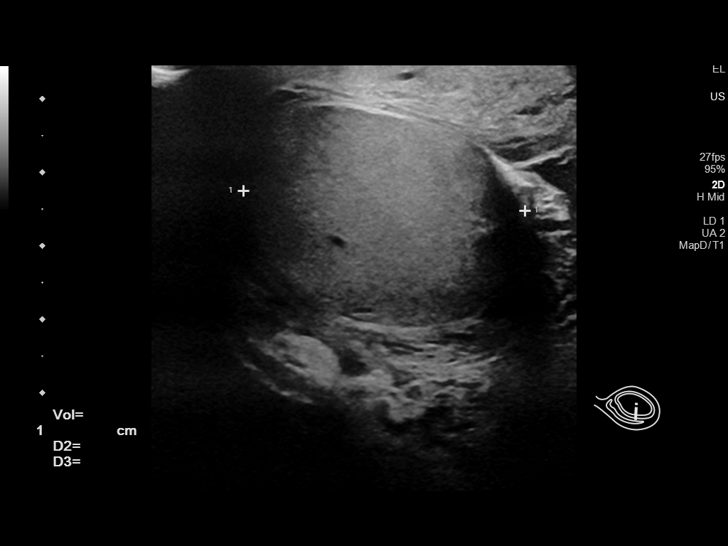
[im 4/43]
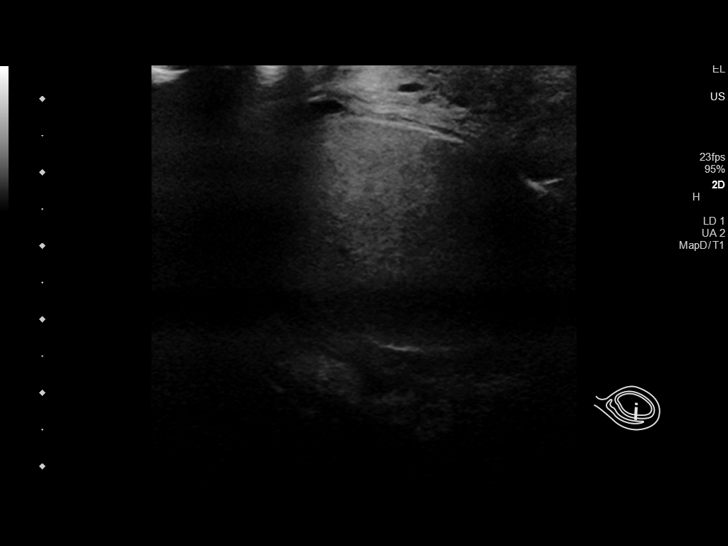
[im 8/43]
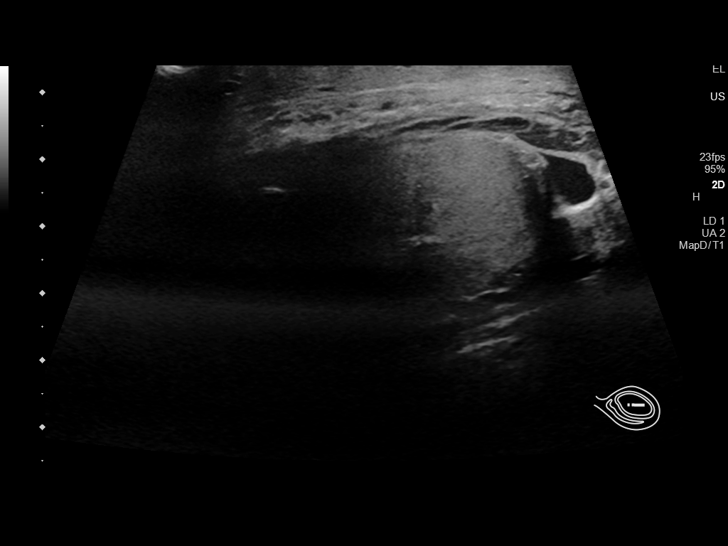
[im 11/43]
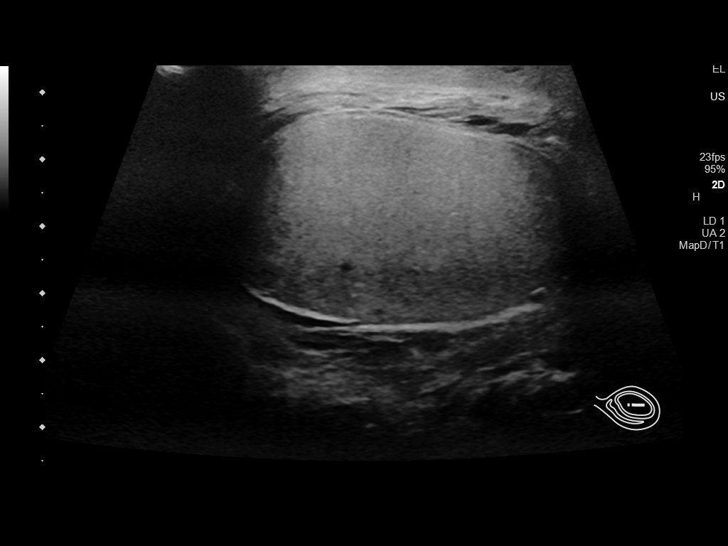
[im 15/43]
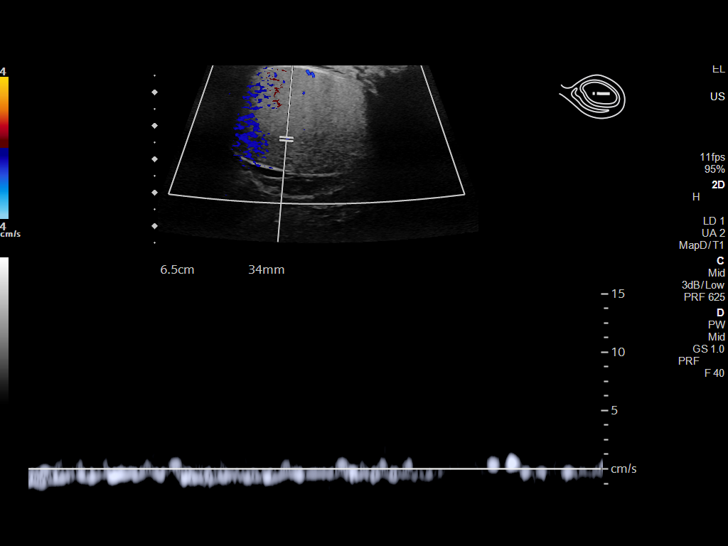
[im 18/43]
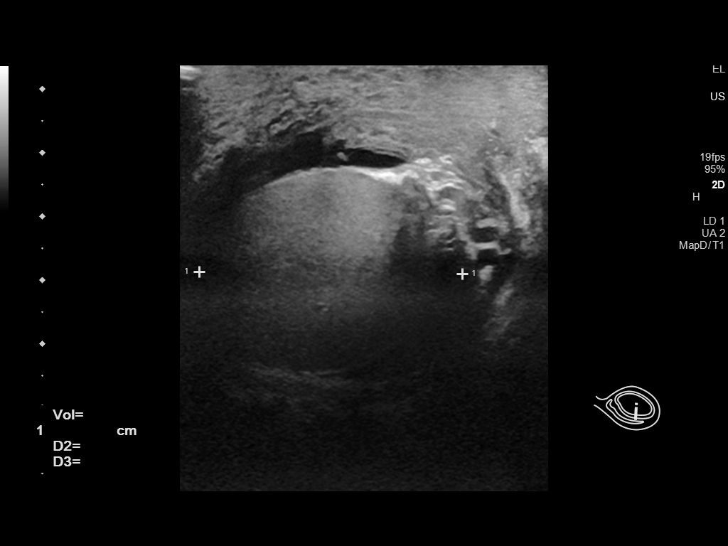
[im 22/43]
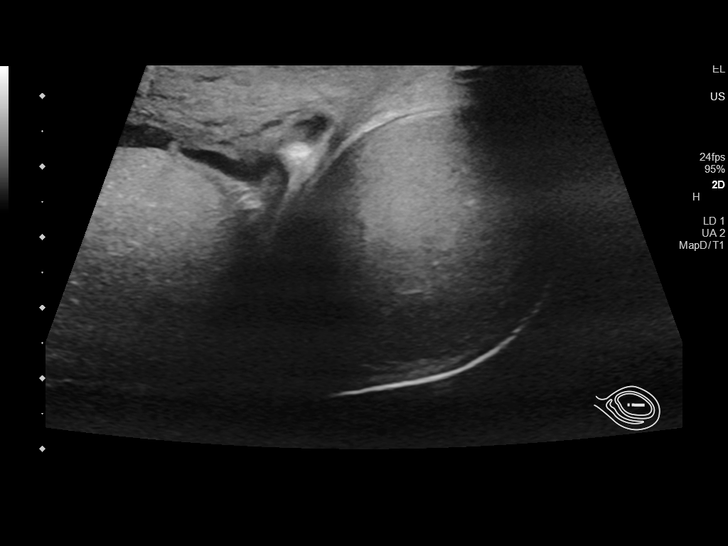
[im 25/43]
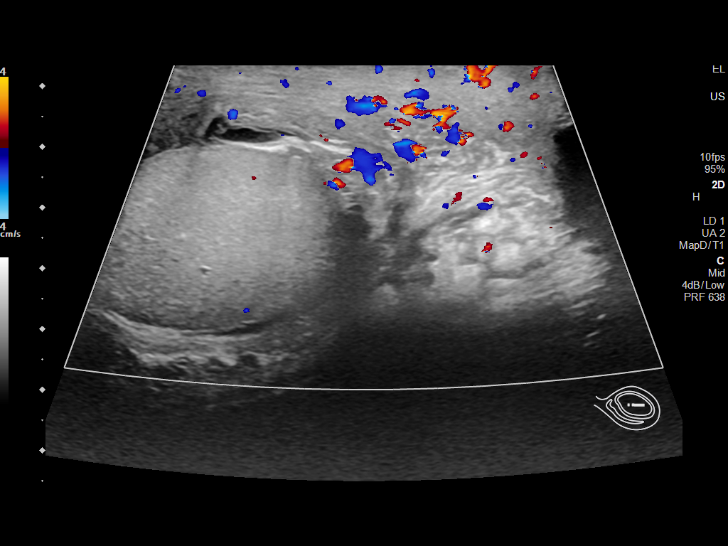
[im 29/43]
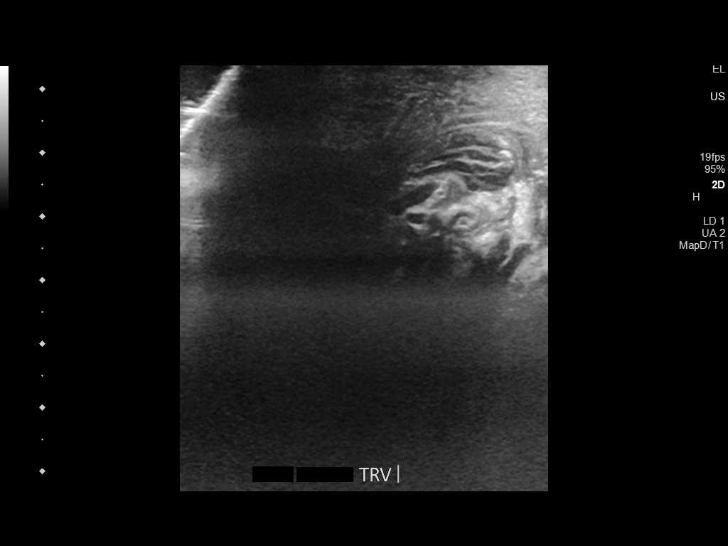
[im 32/43]
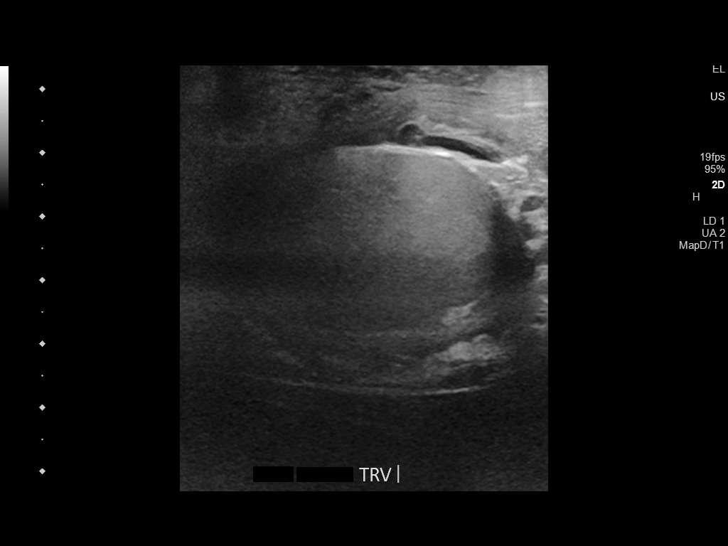
[im 36/43]
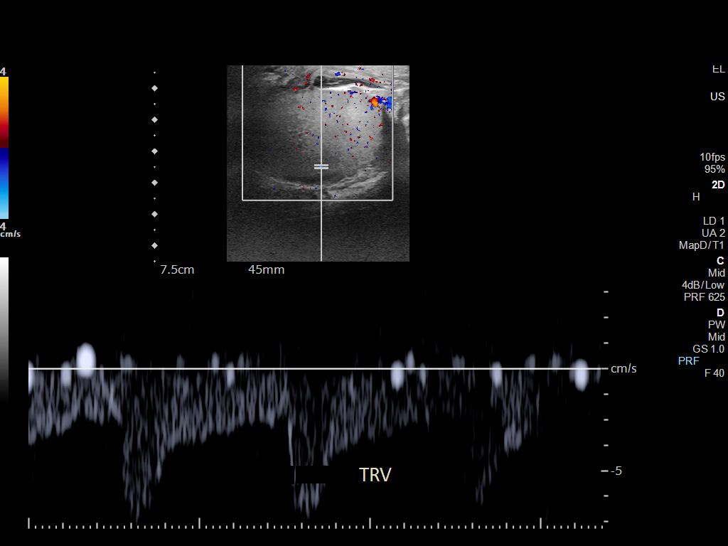
[im 39/43]
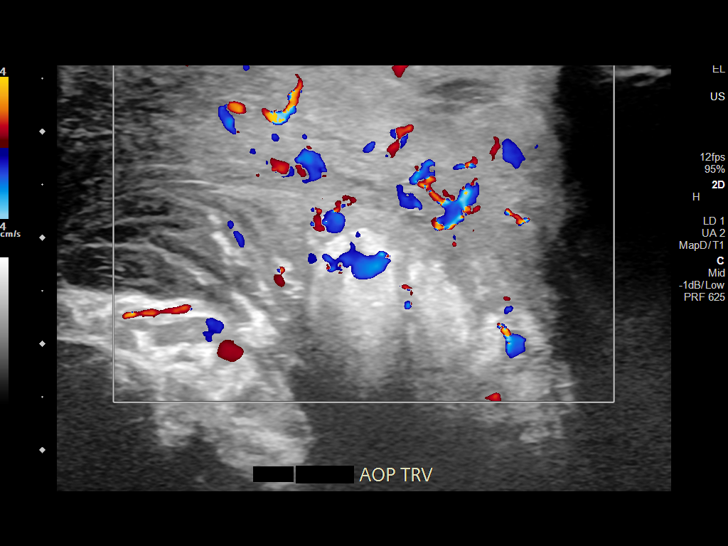
[im 43/43]
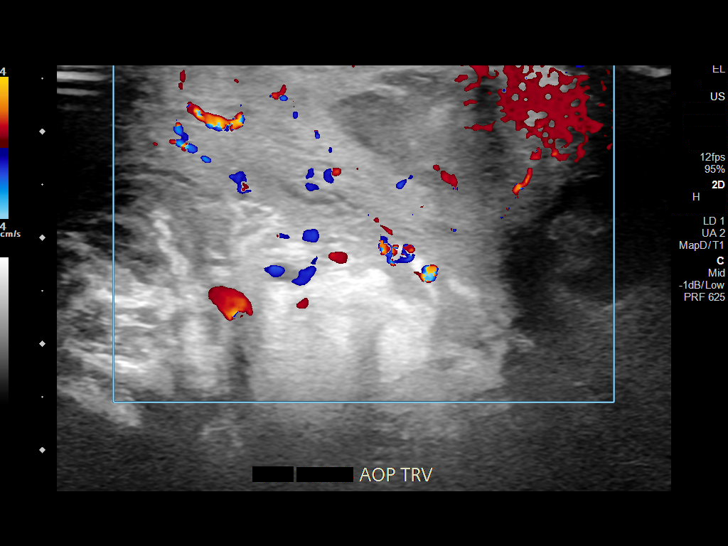

[13 of 25 positions shown; findings below may reference images not displayed]

FINDINGS: Right testicle

Measurements: 5.1 x 3.8 x 3.0 cm. No mass or microlithiasis
visualized.

Left testicle

Measurements: 5.0 x 4.1 x 3.3 cm. No mass or microlithiasis
visualized.

Right epididymis:  Not visualized due to pain tolerance.

Left epididymis:  Not visualized due to pain tolerance.

Hydrocele:  None visualized.

Varicocele:  None visualized.

Pulsed Doppler interrogation of both testes demonstrates normal low
resistance arterial and venous waveforms bilaterally.

2.3 x 2.1 x 1.4 cm complex fluid collection is seen in the soft
tissues adjacent to the left testicle concerning for abscess. This
appears to be smaller compared to prior exam. Doppler demonstrates
peripheral color flow consistent with inflammation.
IMPRESSION: 2.3 x 2.1 x 1.4 cm complex fluid collection is seen in the soft
tissues adjacent the left testicle concerning for abscess. This
appears to be slightly smaller compared to prior exam.

There is no evidence of testicular mass or torsion.

The epididymii cannot be evaluated due to pain tolerance.

## 2020-07-19 NOTE — Care Management Important Message (Signed)
Important Message  Patient Details IM Letter given to the Patient Name: Seth Spence MRN: [id number] Date of Birth: [birth year]   Medicare Important Message Given:  Yes     [name] 07/19/2020, 11:46 AM

## 2020-10-20 NOTE — Discharge Instructions (Signed)
1. You should continue the antibiotics as prescribed. 2. You should change the packing in the wound every two days. The first change should be on Tuesday, 10/13. 3. We will call you to make a follow up appointment to check your wound in about one week.  4. You should call should you develop an inability urinate, fever > 101, or any new concerns or symptoms.  5. Call the office [phone] to notify us of any concerns.

## 2020-11-24 MED ORDER — CEFAZOLIN SODIUM-DEXTROSE 2-4 GM/100ML-% IV SOLN
INTRAVENOUS | Status: AC
  Filled 2020-11-24: qty 100

## 2023-01-16 ENCOUNTER — Ambulatory Visit (INDEPENDENT_AMBULATORY_CARE_PROVIDER_SITE_OTHER): Payer: BC Managed Care – PPO | Admitting: Internal Medicine

## 2023-01-16 ENCOUNTER — Encounter: Payer: Self-pay | Admitting: Internal Medicine

## 2023-01-16 VITALS — BP 152/92 | HR 95 | Temp 97.9°F | Resp 17 | Ht 70.0 in | Wt 148.0 lb

## 2023-01-16 DIAGNOSIS — F341 Dysthymic disorder: Secondary | ICD-10-CM

## 2023-01-16 DIAGNOSIS — R030 Elevated blood-pressure reading, without diagnosis of hypertension: Secondary | ICD-10-CM | POA: Diagnosis not present

## 2023-01-16 MED ORDER — ESCITALOPRAM OXALATE 20 MG PO TABS: ORAL_TABLET | ORAL | 0 refills | Status: DC

## 2023-01-16 NOTE — Progress Notes (Signed)
Future Appointments  Date Time Provider Department  02/21/2023 10:30 AM Unk Pinto, MD [facility]    History of Present Illness:       This very nice 32 y.o. single BM presents tor  re-establish . Patient has hx/o labile HTN, and presents for screening for elevated chol,  abnormal glucose and Vitamin D deficiency. Patient's primary complaint today is "Anxiety". Patient reports "racing thoughts" , frequently feeling anxious or nervous.        Patient is treated for HTN  since  & BP has been controlled at home. Today's BP is elevated at 152/92. Patient has had no complaints of any cardiac type chest pain, palpitations, dyspnea Vertell Limber /PND, dizziness, claudication or dependent edema.        Hyperlipidemia is controlled  with diet .  Last Lipids were at goal 6 years ago :  Lab Results  Component Value Date   CHOL 111 (L) 01/23/2016   HDL 51 01/23/2016   LDLCALC 52 01/23/2016   TRIG 40 01/23/2016   CHOLHDL 2.2 01/23/2016     Also, the patient has had no symptoms of reactive hypoglycemia, diabetic polys, paresthesias or visual blurring.  Last A1c was at  goal :  Lab Results  Component Value Date   HGBA1C 5.4 01/23/2016                                                           Further, the patient also has history of Vitamin D Deficiency and supplements vitamin D . Last vitamin D was very low  :  Lab Results  Component Value Date   VD25OH 16 (L) 01/23/2016    Onno currentmeds    No Known Allergies   PMHx:   Past Medical History:  Diagnosis Date   Allergy    Asthma    Depression       Immunization History  Administered Date(s) Administered   PPD Test 01/23/2016   Tdap 01/23/2016      Past Surgical History:  Procedure Laterality Date   INCISION AND DRAINAGE ABSCESS N/A 08/22/2019   Procedure: INCISION AND DRAINAGE ABSCESS scrotal;  Surgeon: Ceasar Mons, MD;  Location: [facility];  Service: Urology;  Laterality: N/A;   OTHER SURGICAL  HISTORY     two years ago, i had abcess lanced     FHx:    Reviewed / unchanged   SHx:    Reviewed / unchanged    Systems Review:  Constitutional: Denies fever, chills, wt changes, headaches, insomnia, fatigue, night sweats, change in appetite. Eyes: Denies redness, blurred vision, diplopia, discharge, itchy, watery eyes.  ENT: Denies discharge, congestion, post nasal drip, epistaxis, sore throat, earache, hearing loss, dental pain, tinnitus, vertigo, sinus pain, snoring.  CV: Denies chest pain, palpitations, irregular heartbeat, syncope, dyspnea, diaphoresis, orthopnea, PND, claudication or edema. Respiratory: denies cough, dyspnea, DOE, pleurisy, hoarseness, laryngitis, wheezing.  Gastrointestinal: Denies dysphagia, odynophagia, heartburn, reflux, water brash, abdominal pain or cramps, nausea, vomiting, bloating, diarrhea, constipation, hematemesis, melena, hematochezia  or hemorrhoids. Genitourinary: Denies dysuria, frequency, urgency, nocturia, hesitancy, discharge, hematuria or flank pain. Musculoskeletal: Denies arthralgias, myalgias, stiffness, jt. swelling, pain, limping or strain/sprain.  Skin: Denies pruritus, rash, hives, warts, acne, eczema or change in skin lesion(s). Neuro: No weakness, tremor, incoordination, spasms, paresthesia or pain. Psychiatric: Denies confusion, memory  loss or sensory loss. Endo: Denies change in weight, skin or hair change.  Heme/Lymph: No excessive bleeding, bruising or enlarged lymph nodes.   Physical Exam  BP (!) 152/92   Pulse 95   Temp 97.9 F (36.6 C)   Resp 17   Ht 5\' 10"  (1.778 m)   Wt 148 lb (67.1 kg)   SpO2 96%   BMI 21.24 kg/m   Appears  well nourished, well groomed  and in no distress.  Eyes: PERRLA, EOMs, conjunctiva no swelling or erythema. Sinuses: No frontal/maxillary tenderness ENT/Mouth: EAC's clear, TM's nl w/o erythema, bulging. Nares clear w/o erythema, swelling, exudates. Oropharynx clear without erythema or  exudates. Oral hygiene is good. Tongue normal, non obstructing. Hearing intact.  Neck: Supple. Thyroid not palpable. Car 2+/2+ without bruits, nodes or JVD. Chest: Respirations nl with BS clear & equal w/o rales, rhonchi, wheezing or stridor.  Cor: Heart sounds normal w/ regular rate and rhythm without sig. murmurs, gallops, clicks or rubs. Peripheral pulses normal and equal  without edema.  Abdomen: Soft & bowel sounds normal. Non-tender w/o guarding, rebound, hernias, masses or organomegaly.  Lymphatics: Unremarkable.  Musculoskeletal: Full ROM all peripheral extremities, joint stability, 5/5 strength and normal gait.  Skin: Warm, dry without exposed rashes, lesions or ecchymosis apparent.  Neuro: Cranial nerves intact, reflexes equal bilaterally. Sensory-motor testing grossly intact. Tendon reflexes grossly intact.  Pysch: Alert & oriented x 3.  Insight and judgement nl & appropriate. No ideations.   Assessment and Plan:    1. Elevated BP without diagnosis of hypertension  - monitor blood pressure at home.   2. Dysthymia  - Start gLexapro  daily -discussed effects & SE's.         Discussed  regular exercise, BP monitoring, weight control to achieve/maintain BMI less than 25 and discussed med and SE's.   Patient deferred labs til next OV.  I discussed the assessment and treatment plan with the patient. The patient was provided an opportunity to ask questions and all were answered. The patient agreed with the plan and demonstrated an understanding of the instructions.  I provided over 30 minutes of exam, counseling, chart review and  complex critical decision making. Advised 1 month f/u.         The patient was advised to call back or seek an in-person evaluation if the symptoms worsen or if the condition fails to improve as anticipated.   Kirtland Bouchard, MD

## 2023-01-19 ENCOUNTER — Encounter: Payer: Self-pay | Admitting: Internal Medicine

## 2023-02-20 NOTE — Progress Notes (Signed)
Rogers Blocker  h  o  w                                                                                                                                                                                                                                                                                                Future Appointments  Date Time Provider Department Center  02/21/2023 10:30 AM Lucky Cowboy, MD [facility] None    History of Present Illness:     Patient is a very nice 32 yo single BM with hx/o labile elevated BP who was evaluated recently 1 month ago  & started on Lexapro for anxiety / Dysthymia.   Patient has hx/o Low vitamin D  ("16") and presents also for screening for glucose intolerance,  abnormal lipids and evaluation of fatigue.       Current Outpatient Medications on File Prior to Visit  Medication Sig   escitalopram (LEXAPRO) 20 MG tablet Take 1 tablet Daily for Mood   No current facility-administered medications on file prior to visit.    No Known Allergies   Problem list He has Elevated BP; Screening cholesterol level; Screening for diabetes mellitus; Vitamin D deficiency; and Medication management on their problem list.   Observations/Objective:  There were no vitals taken for this visit.  HEENT - WNL. Neck - supple.  Chest - Clear equal BS. Cor - Nl HS. RRR w/o sig MGR. PP 1(+). No edema. MS- FROM w/o deformities.  Gait Nl. Neuro -  Nl w/o focal abnormalities.   Assessment and Plan:      Follow Up Instructions:        I discussed the assessment and treatment plan with the patient. The patient was provided an opportunity to ask questions and all were  answered. The patient agreed with the plan and demonstrated an understanding of the instructions.       The patient was advised to call back or seek an in-person evaluation if the symptoms worsen or if the condition  fails to improve as anticipated.    Marinus Maw, MD

## 2023-02-21 ENCOUNTER — Ambulatory Visit (INDEPENDENT_AMBULATORY_CARE_PROVIDER_SITE_OTHER): Payer: Self-pay | Admitting: Internal Medicine

## 2023-02-21 DIAGNOSIS — Z13220 Encounter for screening for lipoid disorders: Secondary | ICD-10-CM

## 2023-02-21 DIAGNOSIS — R030 Elevated blood-pressure reading, without diagnosis of hypertension: Secondary | ICD-10-CM

## 2023-02-21 DIAGNOSIS — R5383 Other fatigue: Secondary | ICD-10-CM

## 2023-02-21 DIAGNOSIS — Z79899 Other long term (current) drug therapy: Secondary | ICD-10-CM

## 2023-02-21 DIAGNOSIS — E559 Vitamin D deficiency, unspecified: Secondary | ICD-10-CM

## 2023-02-21 DIAGNOSIS — Z91199 Patient's noncompliance with other medical treatment and regimen due to unspecified reason: Secondary | ICD-10-CM

## 2023-02-21 DIAGNOSIS — E7439 Other disorders of intestinal carbohydrate absorption: Secondary | ICD-10-CM

## 2023-03-21 ENCOUNTER — Encounter: Payer: Self-pay | Admitting: Internal Medicine

## 2023-03-21 ENCOUNTER — Ambulatory Visit (INDEPENDENT_AMBULATORY_CARE_PROVIDER_SITE_OTHER): Payer: BC Managed Care – PPO | Admitting: Internal Medicine

## 2023-03-21 VITALS — BP 118/80 | HR 109 | Temp 98.6°F | Ht 70.0 in | Wt 146.0 lb

## 2023-03-21 DIAGNOSIS — E7439 Other disorders of intestinal carbohydrate absorption: Secondary | ICD-10-CM

## 2023-03-21 DIAGNOSIS — Z13220 Encounter for screening for lipoid disorders: Secondary | ICD-10-CM

## 2023-03-21 DIAGNOSIS — F329 Major depressive disorder, single episode, unspecified: Secondary | ICD-10-CM

## 2023-03-21 DIAGNOSIS — R5383 Other fatigue: Secondary | ICD-10-CM

## 2023-03-21 DIAGNOSIS — E559 Vitamin D deficiency, unspecified: Secondary | ICD-10-CM

## 2023-03-21 DIAGNOSIS — Z79899 Other long term (current) drug therapy: Secondary | ICD-10-CM

## 2023-03-21 DIAGNOSIS — R030 Elevated blood-pressure reading, without diagnosis of hypertension: Secondary | ICD-10-CM | POA: Diagnosis not present

## 2023-03-21 DIAGNOSIS — R7309 Other abnormal glucose: Secondary | ICD-10-CM | POA: Diagnosis not present

## 2023-03-21 DIAGNOSIS — F341 Dysthymic disorder: Secondary | ICD-10-CM | POA: Insufficient documentation

## 2023-03-21 DIAGNOSIS — F5104 Psychophysiologic insomnia: Secondary | ICD-10-CM

## 2023-03-21 DIAGNOSIS — F419 Anxiety disorder, unspecified: Secondary | ICD-10-CM | POA: Insufficient documentation

## 2023-03-21 HISTORY — DX: Anxiety disorder, unspecified: F41.9

## 2023-03-21 MED ORDER — BUPROPION HCL ER (XL) 300 MG PO TB24: ORAL_TABLET | ORAL | 3 refills | Status: AC

## 2023-03-21 MED ORDER — TRAZODONE HCL 150 MG PO TABS: ORAL_TABLET | ORAL | 1 refills | Status: AC

## 2023-03-21 NOTE — Progress Notes (Unsigned)
     Future Appointments  Date Time Provider Department  03/21/2023 11:30 AM Lucky Cowboy, MD [facility]    History of Present Illness:     Patient is a very nice 32 yo single BM with hx/o labile elevated BP who was evaluated recently 1 month ago (01/16/23)  & started on Lexapro for anxiety / Dysthymia.   Patient No-Showed 02/21/2023 OV . Patient has hx/o Low vitamin D  ("16") and presents also for screening for glucose intolerance,  abnormal lipids and evaluation of fatigue. Today's BP is 118/80.       Current Outpatient Medications on File Prior to Visit  Medication Sig   escitalopram (LEXAPRO) 20 MG tablet Take 1 tablet Daily for Mood     No Known Allergies   Problem list:                                         He has Elevated BP; Screening cholesterol level; Screening for diabetes mellitus; Vitamin D deficiency; and Medication management on their problem list.   Observations/Objective:  There were no vitals taken for this visit.  HEENT - WNL. Neck - supple.  Chest - Clear equal BS. Cor - Nl HS. RRR w/o sig MGR. PP 1(+). No edema. MS- FROM w/o deformities.  Gait Nl. Neuro -  Nl w/o focal abnormalities.   Assessment and Plan:  1. Elevated BP without diagnosis of hypertension  - CBC with Differential/Platelet - COMPLETE METABOLIC PANEL WITH GFR - Magnesium - TSH  2. Screening cholesterol level  - Lipid panel - TSH  3. Glucose intolerance  - Hemoglobin A1c - Insulin, random  4. Vitamin D deficiency  - VITAMIN D 25 Hydroxy  5. Fatigue  - CBC with Differential/Platelet - TSH  6. Dysthymia  - TSH  7. Chronic anxiety  - TSH  8. Medication management  - CBC with Differential/Platelet - COMPLETE METABOLIC PANEL WITH GFR - Magnesium - Lipid panel - TSH - Hemoglobin A1c - Insulin, random - VITAMIN D 25 Hydroxy    Follow Up Instructions:        I discussed the assessment and treatment plan with the patient. The patient was provided  an opportunity to ask questions and all were answered. The patient agreed with the plan and demonstrated an understanding of the instructions.       The patient was advised to call back or seek an in-person evaluation if the symptoms worsen or if the condition fails to improve as anticipated.    Marinus Maw, MD

## 2023-03-21 NOTE — Patient Instructions (Addendum)
Major Depressive Disorder  Major depressive disorder (MDD) is a mental health condition. It may also be called clinical depression or unipolar depression. MDD causes symptoms of sadness, hopelessness, and loss of interest in things. These symptoms last most of the day, almost every day, for 2 weeks. MDD can also cause physical symptoms. It can interfere with relationships and activities, such as work, school, and activities that are usually pleasant. MDD may be mild, moderate, or severe. It may be single-episode MDD, which happens once, or recurrent MDD, which may occur many times. What are the causes? The exact cause of this condition is not known. What increases the risk? The following factors may make someone more likely to develop MDD: A family history of depression. Being male. Long-term (chronic) stress, physical illness, other mental health disorders, or substance misuse. Trauma, including: Family problems. Violence or abuse. Loss of a parent or close family member. Experiencing discrimination. What are the signs or symptoms? The main symptoms of MDD usually include: Constant depressed or irritable mood. A loss of interest in activities. Sleeping or eating too much or too little. Tiredness or low energy. Other symptoms include: Unexplained weight gain or weight loss. Being agitated, restless, or weak. Feeling hopeless, worthless, or guilty. Trouble thinking clearly or making decisions. Thoughts of suicide or harming others. Spending a lot of time alone. Not being able to complete daily tasks or work. Severe symptoms of this condition may include: Psychotic depression.This may include false beliefs or delusions. It may also include seeing, hearing, tasting, smelling, or feeling things that are not real (hallucinations). Chronic depression or persistent depressive disorder. This is low-level depression that lasts for at least 2 years. Melancholic depression, or feeling  extremely sad and hopeless. Catatonic depression, which includes trouble speaking and trouble moving. Seasonal depression, which is caused by changes in the seasons. How is this diagnosed? This condition may be diagnosed based on: Your symptoms. Your medical and mental health history. A physical exam. Blood tests to rule out other conditions. MDD is confirmed if you have either a depressed mood or loss of interest and at least four other MDD symptoms, most of the day, nearly every day, in a 2-week period. How is this treated? This condition is usually treated by mental health professionals, such as psychologists, psychiatrists, and clinical social workers. You may need more than one type of treatment. Treatment may include: Psychotherapy, also called talk therapy or counseling. Types of psychotherapy include: Cognitive behavioral therapy (CBT). This teaches you to recognize unhealthy feelings, thoughts, and behaviors, and replace them with positive thoughts and actions. Interpersonal therapy (IPT). This helps you to improve the way you communicate with others or relate to them. Family therapy. This treatment includes members of your family. Medicines to treat anxiety and depression. These medicines help to balance the brain chemicals that affect your emotions. Lifestyle changes. You may be asked to: Limit alcohol use and avoid drug use. Get regular exercise. Get plenty of sleep. Make healthy eating choices. Spend more time outdoors. Brain stimulation. This may be done if symptoms are very severe and other treatments have not worked. Examples of this treatment are electroconvulsive therapy and transcranial magnetic stimulation. Follow these instructions at home: Alcohol use Do not drink alcohol if: Your health care provider tells you not to drink. You are pregnant, may be pregnant, or are planning to become pregnant. If you drink alcohol: Limit how much you have to: 0-1 drink a day for  women 0-2 drinks a day for  men. Know how much alcohol is in your drink. In the [place] one drink equals one 12 oz bottle of beer (355 mL), one 5 oz glass of wine (148 mL), or one 1 oz glass of hard liquor (44 mL). Activity Exercise regularly and spend time outdoors. Find activities that you enjoy and make time to do them. Find healthy ways to manage stress, such as: Meditation or deep breathing. Spending time in nature. Journaling. Return to your normal activities as told by your health care provider. Ask your health care provider what activities are safe for you. General instructions Take over-the-counter and prescription medicines only as told by your health care provider. Discuss alcohol use with your health care provider. Alcohol can affect any antidepressant medicines you are taking. Discuss any drug use with your health care provider. Eat a healthy diet and get enough sleep. Consider joining a support group. Your health care provider may be able to recommend one. Keep all follow-up visits. It is important for your health care provider to check on your mood, behavior, and medicines. Your health care provider will make changes to your treatment as needed. Where to find more information [employer] on Mental Illness: nami.[employer] of Mental Health: BloggerCourse.com American Psychiatric Association: psychiatry.org Contact a health care provider if: Your symptoms get worse. You develop new symptoms. Get help right away if: You hurt yourself on purpose (self-harm). You have thoughts about hurting yourself or others. You have hallucinations. Get help right away if you feel like you may hurt yourself or others, or have thoughts about taking your own life. Go to your nearest emergency room or: Call 911. Call the National Suicide Prevention Lifeline at 320-010-2872 or 988. This is open 24 hours a day. Text the Crisis Text Line at  778-530-1042. ===============================================   Managing Depression   Depression is a mental health condition that affects your thoughts, feelings, and actions. Being diagnosed with depression can bring you relief if you did not know why you have felt or behaved a certain way. It could also leave you feeling overwhelmed. Finding ways to manage your symptoms can help you feel more positive about your future. How to manage lifestyle changes Being depressed is difficult. Depression can increase the level of everyday stress. Stress can make depression symptoms worse. You may believe your symptoms cannot be managed or will never improve. However, there are many things you can try to help manage your symptoms. There is hope. Managing stress  Stress is your body's reaction to life changes and events, both good and bad. Stress can add to your feelings of depression. Learning to manage your stress can help lessen your feelings of depression. Try some of the following approaches to reducing your stress (stress reduction techniques): Listen to music that you enjoy and that inspires you. Try using a meditation app or take a meditation class. Develop a practice that helps you connect with your spiritual self. Walk in nature, pray, or go to a place of worship. Practice deep breathing. To do this, inhale slowly through your nose. Pause at the top of your inhale for a few seconds and then exhale slowly, letting yourself relax. Repeat this three or four times. Practice yoga to help relax and work your muscles. Choose a stress reduction technique that works for you. These techniques take time and practice to develop. Set aside 5-15 minutes a day to do them. Therapists can offer training in these techniques. Do these things to help manage stress: Keep  a journal. Know your limits. Set healthy boundaries for yourself and others, such as saying "no" when you think something is too much. Pay attention to  how you react to certain situations. You may not be able to control everything, but you can change your reaction. Add humor to your life by watching funny movies or shows. Make time for activities that you enjoy and that relax you. Spend less time using electronics, especially at night before bed. The light from screens can make your brain think it is time to get up rather than go to bed.  Medicines Medicines, such as antidepressants, are often a part of treatment for depression. Talk with your pharmacist or health care provider about all the medicines, supplements, and herbal products that you take, their possible side effects, and what medicines and other products are safe to take together. Make sure to report any side effects you may have to your health care provider. Relationships Your health care provider may suggest family therapy, couples therapy, or individual therapy as part of your treatment. How to recognize changes Everyone responds differently to treatment for depression. As you recover from depression, you may start to: Have more interest in doing activities. Feel more hopeful. Have more energy. Eat a more regular amount of food. Have better mental focus. It is important to recognize if your depression is not getting better or is getting worse. The symptoms you had in the beginning may return, such as: Feeling tired. Eating too much or too little. Sleeping too much or too little. Feeling restless, agitated, or hopeless. Trouble focusing or making decisions. Having unexplained aches and pains. Feeling irritable, angry, or aggressive. If you or your family members notice these symptoms coming back, let your health care provider know right away. Follow these instructions at home: Activity Try to get some form of exercise each day, such as walking. Try yoga, mindfulness, or other stress reduction techniques. Participate in group activities if you are able. Lifestyle Get  enough sleep. Cut down on or stop using caffeine, tobacco, alcohol, and any other harmful substances. Eat a healthy diet that includes plenty of vegetables, fruits, whole grains, low-fat dairy products, and lean protein. Limit foods that are high in solid fats, added sugar, or salt (sodium). General instructions Take over-the-counter and prescription medicines only as told by your health care provider. Keep all follow-up visits. It is important for your health care provider to check on your mood, behavior, and medicines. Your health care provider may need to make changes to your treatment. Where to find support Talking to others  Friends and family members can be sources of support and guidance. Talk to trusted friends or family members about your condition. Explain your symptoms and let them know that you are working with a health care provider to treat your depression. Tell friends and family how they can help. Finances Find mental health providers that fit with your financial situation. Talk with your health care provider if you are worried about access to food, housing, or medicine. Call your insurance company to learn about your co-pays and prescription plan. Where to find more information You can find support in your area from: Anxiety and Depression Association of America (ADAA): adaa.org Mental Health America: mentalhealthamerica.net [employer] on Mental Illness: nami.org Contact a health care provider if: You stop taking your antidepressant medicines, and you have any of these symptoms: Nausea. Headache. Light-headedness. Chills and body aches. Not being able to sleep (insomnia). You or your friends and  family think your depression is getting worse. Get help right away if: You have thoughts of hurting yourself or others. Get help right away if you feel like you may hurt yourself or others, or have thoughts about taking your own life. Go to your nearest emergency room  or: Call 911. Call the National Suicide Prevention Lifeline at 407-548-6341 or 988. This is open 24 hours a day. Text the Crisis Text Line at 940-017-9307. This information is not intended to replace advice given to you by your health care provider. Make sure you discuss any questions you have with your health care provider. Document Revised: 03/05/2022 Document Reviewed: 03/05/2022 Elsevier Patient Education  2023 [employer].

## 2023-03-23 NOTE — Progress Notes (Signed)
^<^<^<^<^<^<^<^<^<^<^<^<^<^<^<^<^<^<^<^<^<^<^<^<^<^<^<^<^<^<^<^<^<^<^<^<^ ^>^>^>^>^>^>^>^>^>^>^>>^>^>^>^>^>^>^>^>^>^>^>^>^>^>^>^>^>^>^>^>^>^>^>^>^>  -  Test results slightly outside the reference range are not unusual. If there is anything important, I will review this with you,  otherwise it is considered normal test values.  If you have further questions,  please do not hesitate to contact me at the office or via My Chart.   ^<^<^<^<^<^<^<^<^<^<^<^<^<^<^<^<^<^<^<^<^<^<^<^<^<^<^<^<^<^<^<^<^<^<^<^<^ ^>^>^>^>^>^>^>^>^>^>^>^>^>^>^>^>^>^>^>^>^>^>^>^>^>^>^>^>^>^>^>^>^>^>^>^>^  -  Chol = 125   & LDL 68 --  Both  Excellent   - Very low risk for Heart Attack  / Stroke ^<^<^<^<^<^<^<^<^<^<^<^<^<^<^<^<^<^<^<^<^<^<^<^<^<^<^<^<^<^<^<^<^<^<^<^<^ ^>^>^>^>^>^>^>^>^>^>^>^>^>^>^>^>^>^>^>^>^>^>^>^>^>^>^>^>^>^>^>^>^>^>^>^>^  - A1c = 5.9% is borderline elevated blood sugar &                                                          in the very early prediabetic range of 5.7% to 6.4% - So need to     - Avoid Sweets, Candy & White Stuff   - White Rice, White [place], White Flour  - Breads &  Pasta  ^<^<^<^<^<^<^<^<^<^<^<^<^<^<^<^<^<^<^<^<^<^<^<^<^<^<^<^<^<^<^<^<^<^<^<^<^ ^>^>^>^>^>^>^>^>^>^>^>^>^>^>^>^>^>^>^>^>^>^>^>^>^>^>^>^>^>^>^>^>^>^>^>^>^  - Vitamin D = 10 is Extremely & Dangerously Low &                            is   predisposed to severe Depression along with many other problems   - Vitamin D goal is between 70-100.   - Please TAKE Vitamin 5,000 unit capsules = 10,000 units EVERY day  !   - It is very important as a natural anti-inflammatory and helping the  immune system protect against viral infections, like the Covid-19    helping hair, skin, and nails, as well as reducing stroke and  heart attack risk.   - It helps your bones and helps with mood.  - It also decreases numerous cancer risks so please  take it as directed.   - Low Vit D is associated with a 200-300% higher risk  for  CANCER   and 200-300% higher risk for HEART   ATTACK  &  STROKE.    - It is also associated with higher death rate at younger ages,   autoimmune diseases like Rheumatoid arthritis, Lupus,  Multiple Sclerosis.     - Also many other serious conditions, like depression, Alzheimer's  Dementia, infertility, muscle aches, fatigue, fibromyalgia   - just to name a few.  ^<^<^<^<^<^<^<^<^<^<^<^<^<^<^<^<^<^<^<^<^<^<^<^<^<^<^<^<^<^<^<^<^<^<^<^<^ ^>^>^>^>^>^>^>^>^>^>^>^>^>^>^>^>^>^>^>^>^>^>^>^>^>^>^>^>^>^>^>^>^>^>^>^>^  -  All Else - CBC - Kidneys - Electrolytes - Liver - Magnesium & Thyroid    - all  Normal / OK ===========================================================

## 2023-03-24 ENCOUNTER — Encounter: Payer: Self-pay | Admitting: Internal Medicine

## 2023-03-24 LAB — CBC WITH DIFFERENTIAL/PLATELET
Absolute Monocytes: 545 cells/uL (ref 200–950)
Basophils Absolute: 73 cells/uL (ref 0–200)
Basophils Relative: 0.6 %
Eosinophils Absolute: 387 cells/uL (ref 15–500)
Eosinophils Relative: 3.2 %
HCT: 44.2 % (ref 38.5–50.0)
Hemoglobin: 14.5 g/dL (ref 13.2–17.1)
Lymphs Abs: 4162 cells/uL — ABNORMAL HIGH (ref 850–3900)
MCH: 29.1 pg (ref 27.0–33.0)
MCHC: 32.8 g/dL (ref 32.0–36.0)
MCV: 88.8 fL (ref 80.0–100.0)
MPV: 9.4 fL (ref 7.5–12.5)
Monocytes Relative: 4.5 %
Neutro Abs: 6933 cells/uL (ref 1500–7800)
Neutrophils Relative %: 57.3 %
Platelets: 282 10*3/uL (ref 140–400)
RBC: 4.98 10*6/uL (ref 4.20–5.80)
RDW: 13.1 % (ref 11.0–15.0)
Total Lymphocyte: 34.4 %
WBC: 12.1 10*3/uL — ABNORMAL HIGH (ref 3.8–10.8)

## 2023-03-24 LAB — COMPLETE METABOLIC PANEL WITH GFR
AG Ratio: 1.6 (calc) (ref 1.0–2.5)
ALT: 11 U/L (ref 9–46)
AST: 16 U/L (ref 10–40)
Albumin: 4.5 g/dL (ref 3.6–5.1)
Alkaline phosphatase (APISO): 70 U/L (ref 36–130)
BUN: 12 mg/dL (ref 7–25)
CO2: 28 mmol/L (ref 20–32)
Calcium: 9.3 mg/dL (ref 8.6–10.3)
Chloride: 102 mmol/L (ref 98–110)
Creat: 0.93 mg/dL (ref 0.60–1.26)
Globulin: 2.8 g/dL (calc) (ref 1.9–3.7)
Glucose, Bld: 109 mg/dL — ABNORMAL HIGH (ref 65–99)
Potassium: 4.5 mmol/L (ref 3.5–5.3)
Sodium: 141 mmol/L (ref 135–146)
Total Bilirubin: 0.3 mg/dL (ref 0.2–1.2)
Total Protein: 7.3 g/dL (ref 6.1–8.1)
eGFR: 112 mL/min/{1.73_m2} (ref >=60)

## 2023-03-24 LAB — TSH: TSH: 0.88 mIU/L (ref 0.40–4.50)

## 2023-03-24 LAB — VITAMIN D 25 HYDROXY (VIT D DEFICIENCY, FRACTURES): Vit D, 25-Hydroxy: 10 ng/mL — ABNORMAL LOW (ref 30–100)

## 2023-03-24 LAB — LIPID PANEL
Cholesterol: 125 mg/dL (ref <200)
HDL: 39 mg/dL — ABNORMAL LOW (ref >=40)
LDL Cholesterol (Calc): 68 mg/dL (calc)
Non-HDL Cholesterol (Calc): 86 mg/dL (calc) (ref <130)
Total CHOL/HDL Ratio: 3.2 (calc) (ref <5.0)
Triglycerides: 98 mg/dL (ref <150)

## 2023-03-24 LAB — HEMOGLOBIN A1C
Hgb A1c MFr Bld: 5.9 % of total Hgb — ABNORMAL HIGH (ref <5.7)
Mean Plasma Glucose: 123 mg/dL
eAG (mmol/L): 6.8 mmol/L

## 2023-03-24 LAB — MAGNESIUM: Magnesium: 2 mg/dL (ref 1.5–2.5)

## 2023-03-24 LAB — INSULIN, RANDOM: Insulin: 47.3 u[IU]/mL — ABNORMAL HIGH

## 2023-04-04 ENCOUNTER — Other Ambulatory Visit: Payer: Self-pay | Admitting: Internal Medicine

## 2023-04-26 ENCOUNTER — Other Ambulatory Visit: Payer: Self-pay | Admitting: Internal Medicine

## 2024-05-13 DIAGNOSIS — F410 Panic disorder [episodic paroxysmal anxiety] without agoraphobia: Secondary | ICD-10-CM | POA: Diagnosis not present

## 2024-05-13 DIAGNOSIS — F411 Generalized anxiety disorder: Secondary | ICD-10-CM | POA: Diagnosis not present

## 2024-05-13 DIAGNOSIS — F332 Major depressive disorder, recurrent severe without psychotic features: Secondary | ICD-10-CM | POA: Diagnosis not present

## 2024-05-13 DIAGNOSIS — Z6821 Body mass index (BMI) 21.0-21.9, adult: Secondary | ICD-10-CM | POA: Diagnosis not present

## 2024-06-17 DIAGNOSIS — F410 Panic disorder [episodic paroxysmal anxiety] without agoraphobia: Secondary | ICD-10-CM | POA: Diagnosis not present

## 2024-06-17 DIAGNOSIS — F332 Major depressive disorder, recurrent severe without psychotic features: Secondary | ICD-10-CM | POA: Diagnosis not present

## 2024-06-17 DIAGNOSIS — F411 Generalized anxiety disorder: Secondary | ICD-10-CM | POA: Diagnosis not present

## 2024-06-17 DIAGNOSIS — Z6822 Body mass index (BMI) 22.0-22.9, adult: Secondary | ICD-10-CM | POA: Diagnosis not present

## 2024-08-30 ENCOUNTER — Ambulatory Visit
Admission: EM | Admit: 2024-08-30 | Discharge: 2024-08-30 | Disposition: A | Discharge status: Home / Self Care | Payer: Self-pay | Attending: Family Medicine | Admitting: Family Medicine

## 2024-08-30 DIAGNOSIS — R1010 Upper abdominal pain, unspecified: Secondary | ICD-10-CM

## 2024-08-30 DIAGNOSIS — R110 Nausea: Secondary | ICD-10-CM

## 2024-08-30 MED ORDER — FAMOTIDINE 20 MG PO TABS: 20.0000 mg | ORAL_TABLET | Freq: Two times a day (BID) | ORAL | 0 refills | Status: AC

## 2024-08-30 MED ORDER — ONDANSETRON 8 MG PO TBDP: 8.0000 mg | ORAL_TABLET | Freq: Three times a day (TID) | ORAL | 0 refills | Status: AC | PRN

## 2024-08-30 NOTE — ED Provider Notes (Signed)
 [facility]  Note:  This document was prepared using Conservation officer, historic buildings and may include unintentional dictation errors.  MRN: [id number] DOB: 05-15-1991  Subjective:   Seth Spence is a 33 y.o. male presenting for 2-3 day history of nausea without vomiting, upper abdominal discomfort/fullness but not pain. Has had some loose stools. No fever, bloody stools, recent antibiotic use, hospitalizations or long distance travel.  Has not eaten raw foods, drank unfiltered water.  No history of GI disorders including Crohn's, IBS, ulcerative colitis. No alcohol. Smokes marijuana multiple times daily.  Patient does endorse that he eats very poorly.  Generally eats takeout, fast food.  He is a single father of 2 children and has a difficult time in his meals.  No current facility-administered medications for this encounter.  Current Outpatient Medications:    acetaminophen  (TYLENOL ) 325 MG tablet, Take 650 mg by mouth every 6 (six) hours as needed., Disp: , Rfl:    bismuth subsalicylate (PEPTO BISMOL) 262 MG/15ML suspension, Take 30 mLs by mouth every 6 (six) hours as needed., Disp: , Rfl:    buPROPion  (WELLBUTRIN  XL) 300 MG 24 hr tablet, Take 1 tablet Daily for Mood, Focus & Concentration., Disp: 90 tablet, Rfl: 3   escitalopram  (LEXAPRO ) 20 MG tablet, Take  1 tablet  Daily for Mood                              /                                                                   TAKE                                         BY                                                 MOUTH, Disp: 90 tablet, Rfl: 1   traZODone  (DESYREL ) 150 MG tablet, Take  1/2 to 1 tablet   1 to 2 hours   before Bedtime  for Sleep, Disp: 90 tablet, Rfl: 1   No Known Allergies  Past Medical History:  Diagnosis Date   Allergy    Asthma    Chronic anxiety 03/21/2023   Depression      Past Surgical History:  Procedure Laterality Date   INCISION AND DRAINAGE ABSCESS N/A 08/22/2019    Procedure: INCISION AND DRAINAGE ABSCESS scrotal;  Surgeon: Devere Lonni Righter, MD;  Location: [facility];  Service: Urology;  Laterality: N/A;   OTHER SURGICAL HISTORY     two years ago, i had abcess lanced    Family History  Problem Relation Age of Onset   Hypertension Mother    Depression Mother    Hypertension Maternal Grandmother     Social History   Tobacco Use   Smoking status: Light Smoker    Current packs/day: 0.25    Types: Cigarettes   Smokeless tobacco: Never  Vaping Use   Vaping status: Never Used  Substance Use Topics   Alcohol use: Not Currently    Comment: 1 beer per day   Drug use: Yes    Frequency: 3.0 times per week    Types: Marijuana    ROS   Objective:   Vitals: BP (!) 146/92 (BP Location: Left Arm)   Pulse 69   Temp 99.5 F (37.5 C) (Oral)   Resp 16   SpO2 97%   Physical Exam Constitutional:      General: He is not in acute distress.    Appearance: Normal appearance. He is well-developed and normal weight. He is not ill-appearing, toxic-appearing or diaphoretic.  HENT:     Head: Normocephalic and atraumatic.     Right Ear: External ear normal.     Left Ear: External ear normal.     Nose: Nose normal.     Mouth/Throat:     Pharynx: Oropharynx is clear.  Eyes:     General: No scleral icterus.       Right eye: No discharge.        Left eye: No discharge.     Extraocular Movements: Extraocular movements intact.  Cardiovascular:     Rate and Rhythm: Normal rate.  Pulmonary:     Effort: Pulmonary effort is normal.  Abdominal:     General: Bowel sounds are normal. There is no distension.     Palpations: Abdomen is soft. There is no mass.     Tenderness: There is no abdominal tenderness. There is no right CVA tenderness, left CVA tenderness, guarding or rebound.  Musculoskeletal:     Cervical back: Normal range of motion.  Neurological:     Mental Status: He is alert and oriented to person, place, and time.  Psychiatric:         Mood and Affect: Mood normal.        Behavior: Behavior normal.        Thought Content: Thought content normal.        Judgment: Judgment normal.     Assessment and Plan :   PDMP not reviewed this encounter.  1. Upper abdominal discomfort   2. Nausea without vomiting    Patient declined workup.  Offered famotidine, Zofran  for supportive care and symptomatic relief.  Emphasized need for significant dietary modifications.  No signs of an acute abdomen.  Counseled patient on potential for adverse effects with medications prescribed/recommended today, ER and return-to-clinic precautions discussed, patient verbalized understanding.    Christopher Savannah, [place] 08/30/24 [id number]

## 2024-08-30 NOTE — ED Triage Notes (Signed)
 Pt reports nausea sitting on top of stomach, feeling weak x 2 1/2 day. Pepto and Tylenol  gives no relief.

## 2024-08-30 NOTE — Discharge Instructions (Addendum)
 Make sure you push fluids drinking mostly water but mix it with Gatorade.  Try to eat light meals including soups, broths and soft foods, fruits.  You may use Zofran  for your nausea and vomiting once every 8 hours.  Imodium can help with diarrhea but use this carefully limiting it to 1-2 times per day only if you are having a lot of diarrhea.  Please return to the clinic if symptoms worsen or you start having severe abdominal pain not helped by taking Tylenol  or start having bloody stools or blood in the vomit. Use famotidine to address acidity in your stomach.

## 2024-09-14 DIAGNOSIS — F411 Generalized anxiety disorder: Secondary | ICD-10-CM | POA: Diagnosis not present

## 2024-09-14 DIAGNOSIS — F410 Panic disorder [episodic paroxysmal anxiety] without agoraphobia: Secondary | ICD-10-CM | POA: Diagnosis not present

## 2024-09-14 DIAGNOSIS — F332 Major depressive disorder, recurrent severe without psychotic features: Secondary | ICD-10-CM | POA: Diagnosis not present

## 2024-09-14 DIAGNOSIS — F17210 Nicotine dependence, cigarettes, uncomplicated: Secondary | ICD-10-CM | POA: Diagnosis not present

## 2024-11-27 ENCOUNTER — Ambulatory Visit
Admission: EM | Admit: 2024-11-27 | Discharge: 2024-11-27 | Disposition: A | Discharge status: Home / Self Care | Attending: Family Medicine | Admitting: Family Medicine

## 2024-11-27 DIAGNOSIS — K047 Periapical abscess without sinus: Secondary | ICD-10-CM | POA: Diagnosis not present

## 2024-11-27 MED ORDER — NAPROXEN 500 MG PO TABS: 500.0000 mg | ORAL_TABLET | Freq: Two times a day (BID) | ORAL | 0 refills | Status: AC

## 2024-11-27 MED ORDER — AMOXICILLIN-POT CLAVULANATE 875-125 MG PO TABS: 1.0000 | ORAL_TABLET | Freq: Two times a day (BID) | ORAL | 0 refills | Status: AC

## 2024-11-27 NOTE — ED Provider Notes (Signed)
 [facility]  Note:  This document was prepared using Conservation officer, historic buildings and may include unintentional dictation errors.  MRN: [id number] DOB: 1991-02-20  Subjective:   Seth Spence is a 34 y.o. male presenting for 1 week history of moderate to severe left upper dental pain.  Patient reports that he has felt a defect in between the molars.  Has had some facial pain and swelling as well.  He does have a dental practice but has not been able to establish an appointment for follow-up with them.  Current Outpatient Medications  Medication Instructions   acetaminophen  (TYLENOL ) 650 mg, Every 6 hours PRN   ALPRAZolam (XANAX) 0.5 mg, Daily PRN   bismuth subsalicylate (PEPTO BISMOL) 262 MG/15ML suspension 30 mLs, Every 6 hours PRN   buPROPion  (WELLBUTRIN  XL) 300 MG 24 hr tablet Take 1 tablet Daily for Mood, Focus & Concentration.   escitalopram  (LEXAPRO ) 20 MG tablet Take  1 tablet  Daily for Mood                              /                                                                   TAKE                                         BY                                                 MOUTH   famotidine  (PEPCID ) 20 mg, Oral, 2 times daily   ondansetron  (ZOFRAN -ODT) 8 mg, Oral, Every 8 hours PRN   traZODone  (DESYREL ) 150 MG tablet Take  1/2 to 1 tablet   1 to 2 hours   before Bedtime  for Sleep    Allergies[1]  Past Medical History:  Diagnosis Date   Allergy    Asthma    Chronic anxiety 03/21/2023   Depression      Past Surgical History:  Procedure Laterality Date   INCISION AND DRAINAGE ABSCESS N/A 08/22/2019   Procedure: INCISION AND DRAINAGE ABSCESS scrotal;  Surgeon: Devere Lonni Righter, MD;  Location: [facility];  Service: Urology;  Laterality: N/A;   OTHER SURGICAL HISTORY     two years ago, i had abcess lanced    Family History  Problem Relation Age of Onset   Hypertension Mother    Depression Mother    Hypertension Maternal  Grandmother     Social History   Occupational History   Not on file  Tobacco Use   Smoking status: Every Day    Current packs/day: 0.25    Types: Cigarettes   Smokeless tobacco: Never  Vaping Use   Vaping status: Never Used  Substance and Sexual Activity   Alcohol use: Not Currently   Drug use: Yes    Types: Marijuana   Sexual activity: Yes    Birth control/protection:  Condom     ROS   Objective:   Vitals: BP (!) 133/93 (BP Location: Right Arm)   Pulse 72   Temp 99.2 F (37.3 C) (Oral)   Resp 16   SpO2 97%   Physical Exam Constitutional:      General: He is not in acute distress.    Appearance: Normal appearance. He is well-developed and normal weight. He is not ill-appearing, toxic-appearing or diaphoretic.  HENT:     Head: Normocephalic and atraumatic.     Right Ear: External ear normal.     Left Ear: External ear normal.     Nose: Nose normal.     Mouth/Throat:     Dentition: Does not have dentures. Dental tenderness and dental caries present. No gingival swelling, dental abscesses or gum lesions.     Pharynx: Oropharynx is clear. No pharyngeal swelling, oropharyngeal exudate, posterior oropharyngeal erythema or uvula swelling.     Tonsils: No tonsillar exudate or tonsillar abscesses. 0 on the right. 0 on the left.      Comments: Obvious dental defects, caries over molars outlined. Eyes:     General: No scleral icterus.       Right eye: No discharge.        Left eye: No discharge.     Extraocular Movements: Extraocular movements intact.  Cardiovascular:     Rate and Rhythm: Normal rate.  Pulmonary:     Effort: Pulmonary effort is normal.  Musculoskeletal:     Cervical back: Normal range of motion.  Neurological:     Mental Status: He is alert and oriented to person, place, and time.  Psychiatric:        Mood and Affect: Mood normal.        Behavior: Behavior normal.        Thought Content: Thought content normal.        Judgment: Judgment normal.      Assessment and Plan :   PDMP not reviewed this encounter.  1. Dental infection      Start Augmentin  for dental infection/abscess, use naproxen  for pain and inflammation. Emphasized need for dental surgeon consult. Counseled patient on potential for adverse effects with medications prescribed/recommended today, strict ER and return-to-clinic precautions discussed, patient verbalized understanding.     [1] No Known Allergies    Christopher Savannah, PA-C 11/27/24 1458  "

## 2024-11-27 NOTE — ED Triage Notes (Signed)
 Pt c/o upper left dental pain x ~1 week-taking tylenol  and using oragel mouthwash-NAD-steady gait

## 2024-11-27 NOTE — Discharge Instructions (Signed)
Make sure you schedule an appointment with a dentist/dental surgeon as soon as possible.  You may try some of the resources below.    [facility] in [place], [place] Address: [address], [place], [place] [address] Phone: [phone]  [place] [phone] extension [phone] [address].  Dr. Donn Pierini [phone] [place] [phone] [address] [facility] [place].  Rescue mission [phone] extension [phone] [address], [place], [place], [address] First come first serve for the first 10 clients.  May do simple extractions only, no wisdom teeth or surgery.  You may try the second for Thursday of the month starting at [place] [facility] You may call the school to see if they are still helping to provide dental care for emergent cases.
